# Patient Record
Sex: Male | Born: 1965 | Race: Black or African American | Hispanic: No | Marital: Married | Smoking: Never smoker
Health system: Southern US, Community
[De-identification: ages and names within clinical notes are randomized; demographics above are authoritative.]

## PROBLEM LIST (undated history)

## (undated) DIAGNOSIS — E039 Hypothyroidism, unspecified: Secondary | ICD-10-CM

---

## 2005-08-30 ENCOUNTER — Ambulatory Visit: Payer: Self-pay | Admitting: Family Medicine

## 2007-07-04 ENCOUNTER — Ambulatory Visit: Payer: Self-pay | Admitting: Family Medicine

## 2007-07-06 ENCOUNTER — Encounter: Payer: Self-pay | Admitting: Family Medicine

## 2007-08-23 ENCOUNTER — Ambulatory Visit: Payer: Self-pay | Admitting: Family Medicine

## 2007-08-23 DIAGNOSIS — E0590 Thyrotoxicosis, unspecified without thyrotoxic crisis or storm: Secondary | ICD-10-CM | POA: Insufficient documentation

## 2007-08-23 DIAGNOSIS — M722 Plantar fascial fibromatosis: Secondary | ICD-10-CM

## 2007-11-01 ENCOUNTER — Ambulatory Visit: Payer: Self-pay | Admitting: Family Medicine

## 2007-11-01 DIAGNOSIS — R220 Localized swelling, mass and lump, head: Secondary | ICD-10-CM

## 2007-11-01 DIAGNOSIS — R221 Localized swelling, mass and lump, neck: Secondary | ICD-10-CM

## 2009-04-01 IMAGING — US US THYROID
1 series · 18 of 25 positions shown · non-contrast
Comparison: none

REASON FOR EXAM: hyperthyroidism
COMMENTS:

PROCEDURE:     US  - US THYROID  - July 04, 2007  [DATE]
RESULT:     The RIGHT thyroid lobe measures 5.4 x 2.6 x 2.6 cm. The LEFT
thyroid lobe measures 4.9 x 2.1 x 2.5 cm. The echotexture is heterogeneous.
No discrete nodule or cyst is identified.

[Series 1: us thyroid · 18 of 35 slices shown]
[im 1/35]
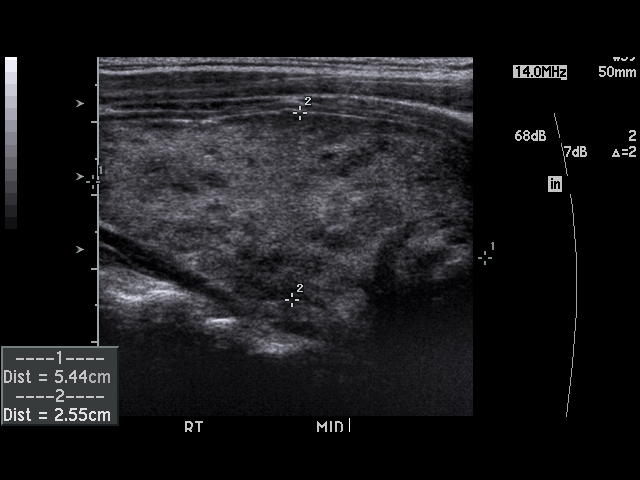
[im 3/35]
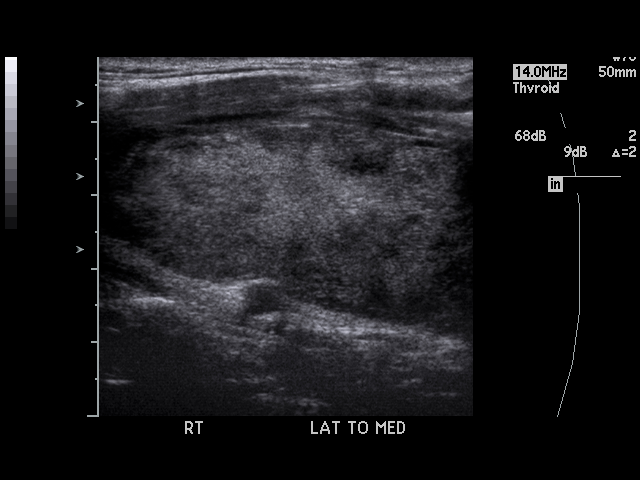
[im 5/35]
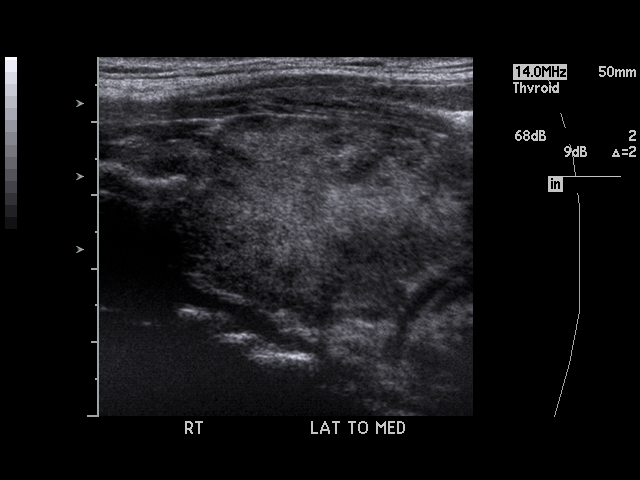
[im 6/35]
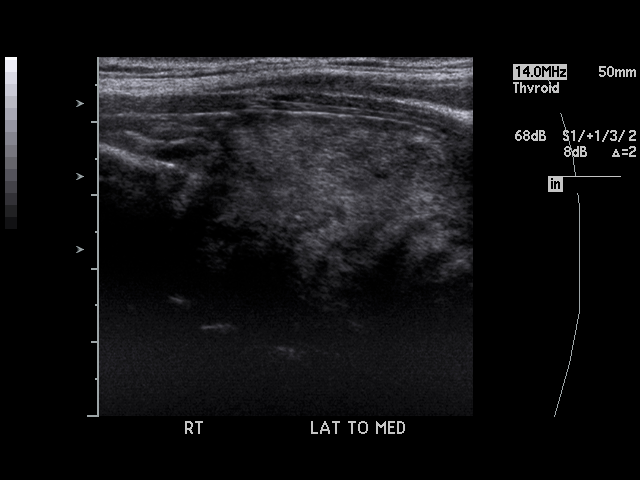
[im 9/35]
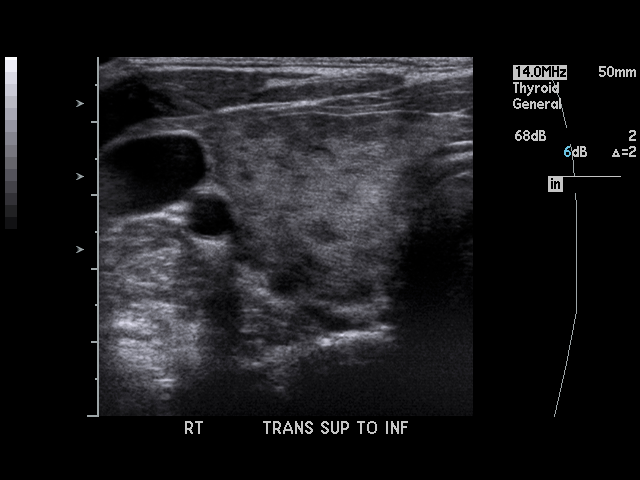
[im 10/35]
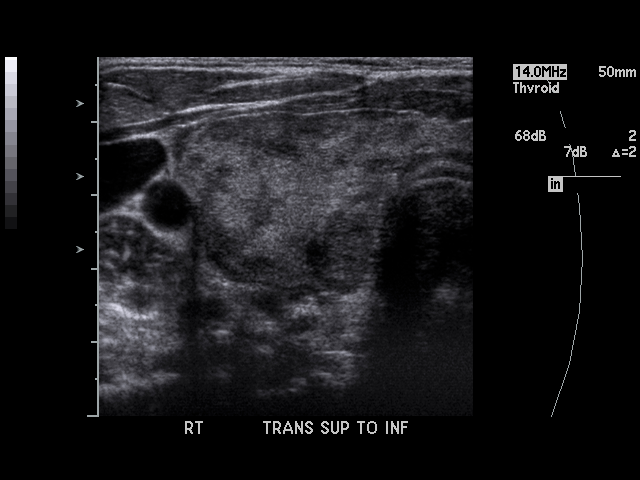
[im 13/35]
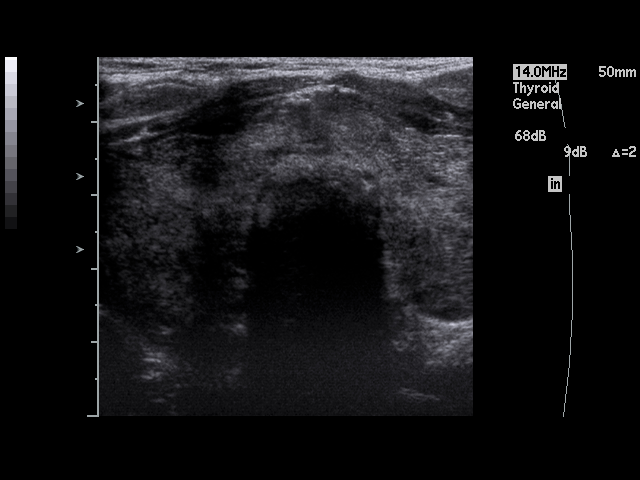
[im 15/35]
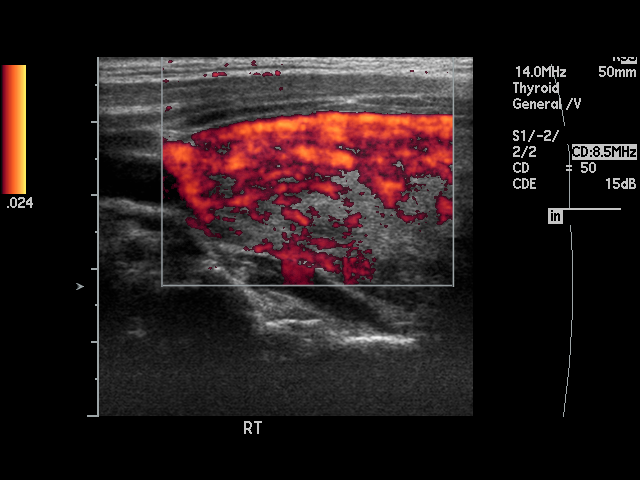
[im 16/35]
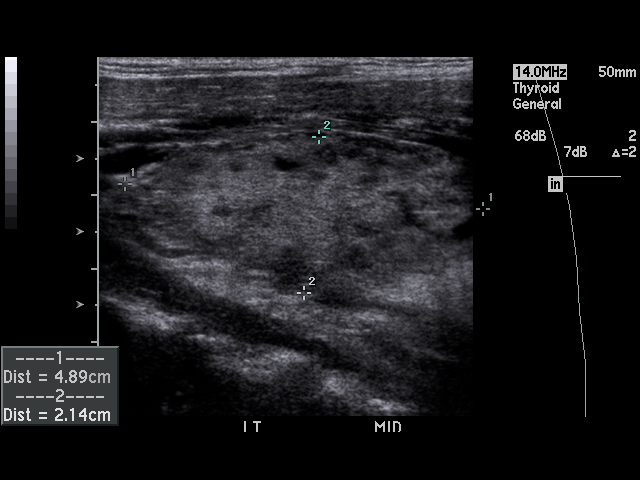
[im 19/35]
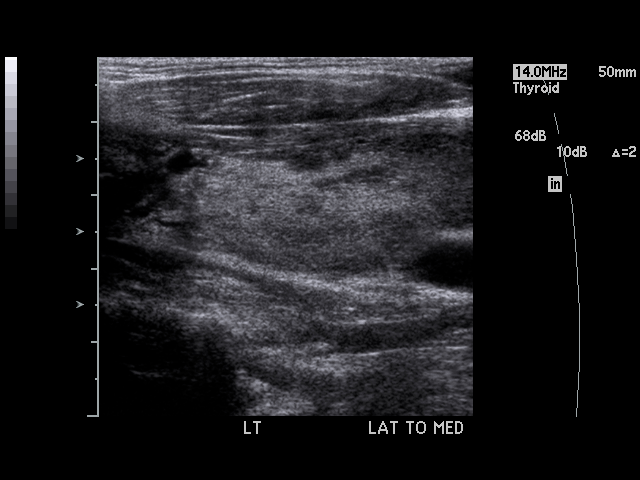
[im 20/35]
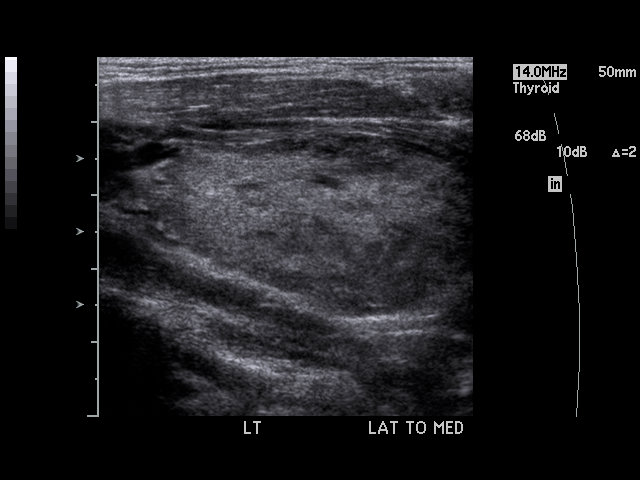
[im 22/35]
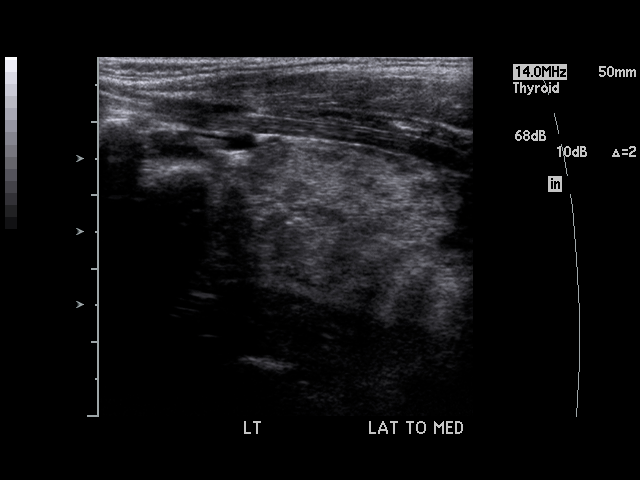
[im 25/35]
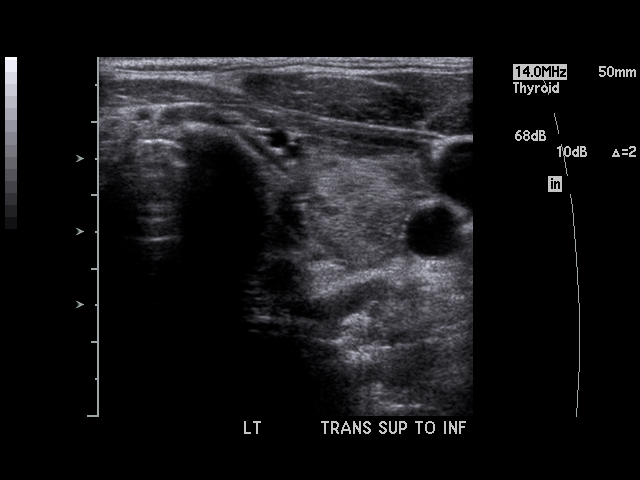
[im 26/35]
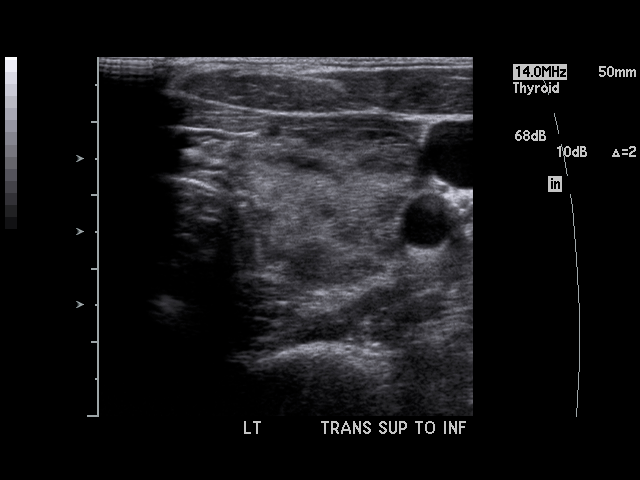
[im 29/35]
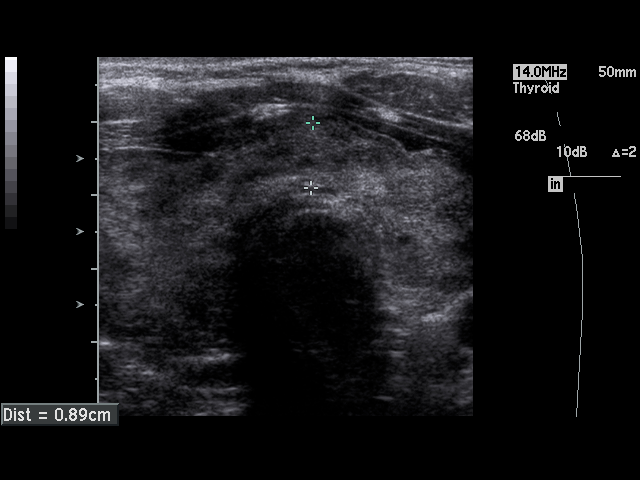
[im 30/35]
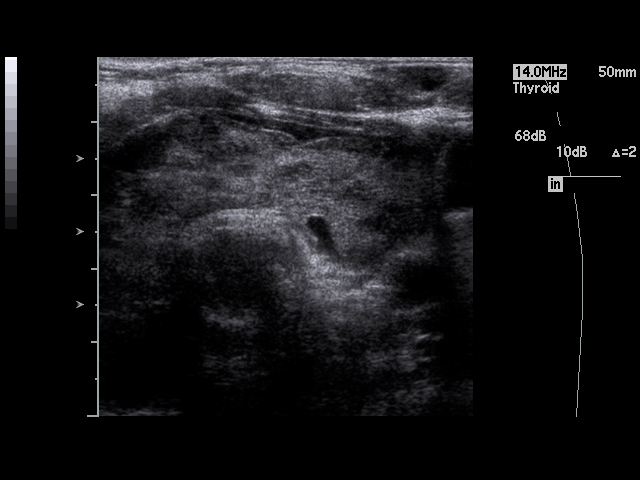
[im 32/35]
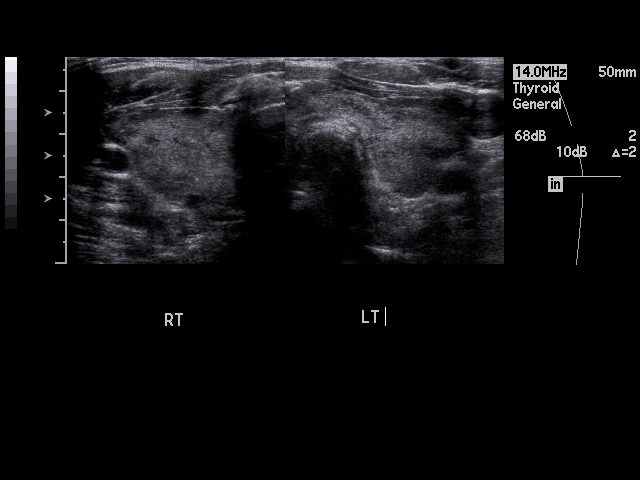
[im 35/35]
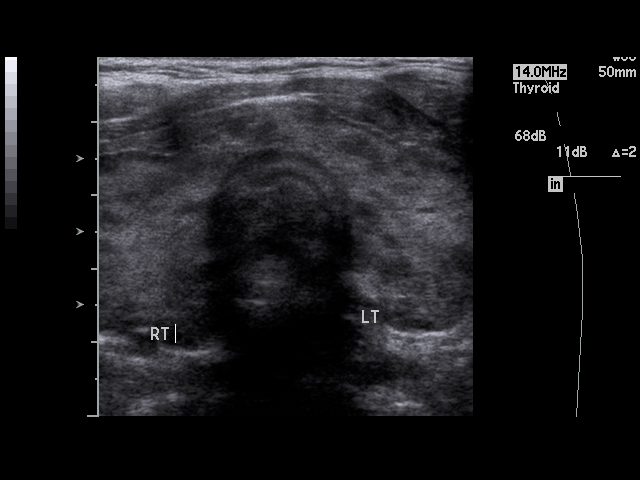

[18 of 25 positions shown; findings below may reference images not displayed]

IMPRESSION: There is a heterogeneous echotexture of the thyroid lobes. I do not see
discrete abnormal nodules.

## 2014-03-18 ENCOUNTER — Ambulatory Visit: Payer: Self-pay | Admitting: Gastroenterology

## 2016-05-03 ENCOUNTER — Other Ambulatory Visit: Payer: Self-pay | Admitting: Internal Medicine

## 2016-05-03 ENCOUNTER — Ambulatory Visit
Admission: RE | Admit: 2016-05-03 | Discharge: 2016-05-03 | Disposition: A | Discharge status: Home / Self Care | Payer: BLUE CROSS/BLUE SHIELD | Source: Ambulatory Visit | Attending: Internal Medicine | Admitting: Internal Medicine

## 2016-05-03 DIAGNOSIS — M25561 Pain in right knee: Secondary | ICD-10-CM

## 2016-05-03 DIAGNOSIS — M7989 Other specified soft tissue disorders: Secondary | ICD-10-CM | POA: Insufficient documentation

## 2016-05-03 DIAGNOSIS — M79604 Pain in right leg: Secondary | ICD-10-CM | POA: Insufficient documentation

## 2016-05-03 DIAGNOSIS — M25562 Pain in left knee: Secondary | ICD-10-CM

## 2018-06-01 IMAGING — US US EXTREM LOW VENOUS BILAT
1 series · 13 of 24 positions shown · non-contrast
Comparison: None.

CLINICAL DATA: Lower extremity pain for 5 days

EXAM:
BILATERAL LOWER EXTREMITY VENOUS DUPLEX ULTRASOUND
TECHNIQUE: Gray-scale sonography with graded compression, as well as color
Doppler and duplex ultrasound were performed to evaluate the lower
extremity deep venous systems from the level of the common femoral
vein and including the common femoral, femoral, profunda femoral,
popliteal and calf veins including the posterior tibial, peroneal
and gastrocnemius veins when visible. The superficial great
saphenous vein was also interrogated. Spectral Doppler was utilized
to evaluate flow at rest and with distal augmentation maneuvers in
the common femoral, femoral and popliteal veins.

[Series 1: us extrem low venous bilat · 13 of 64 slices shown]
[im 1/64]
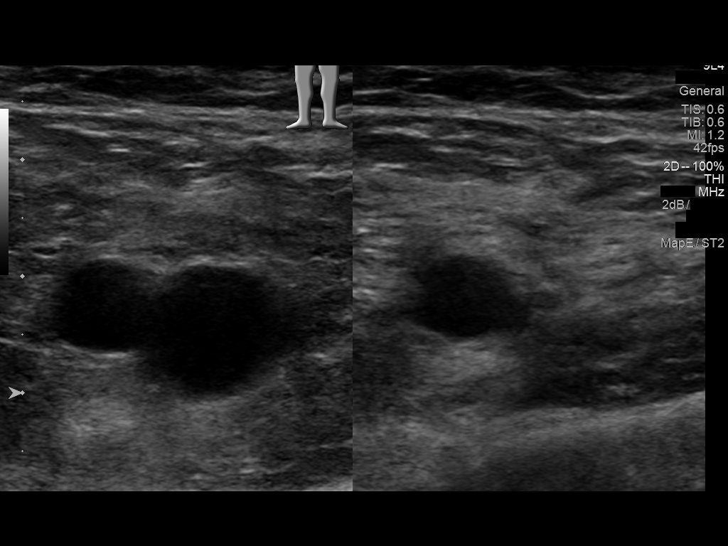
[im 6/64]
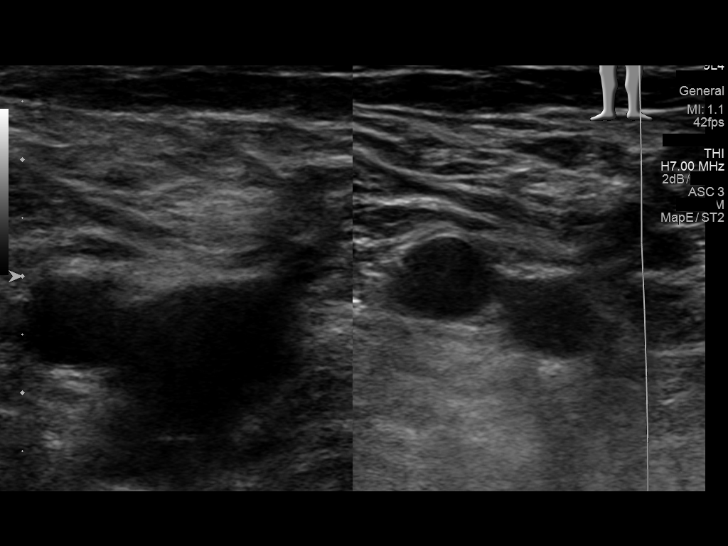
[im 11/64]
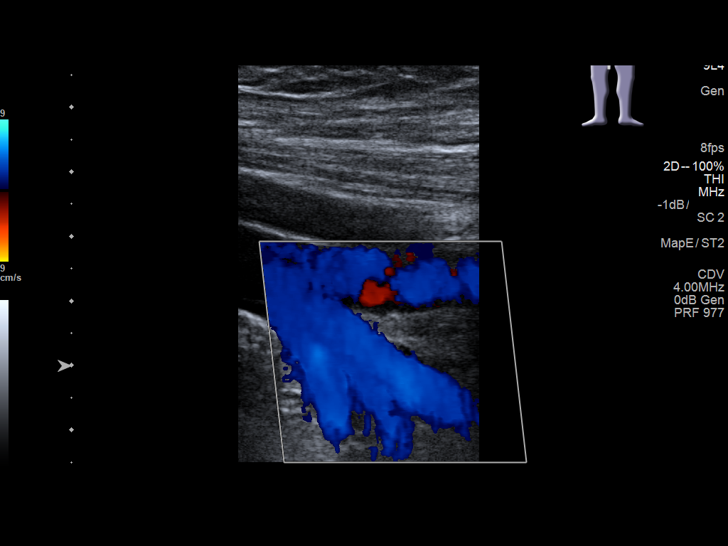
[im 17/64]
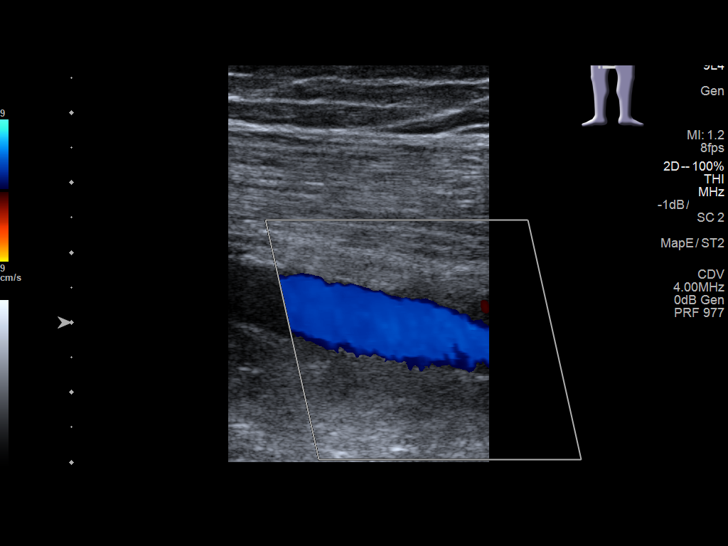
[im 22/64]
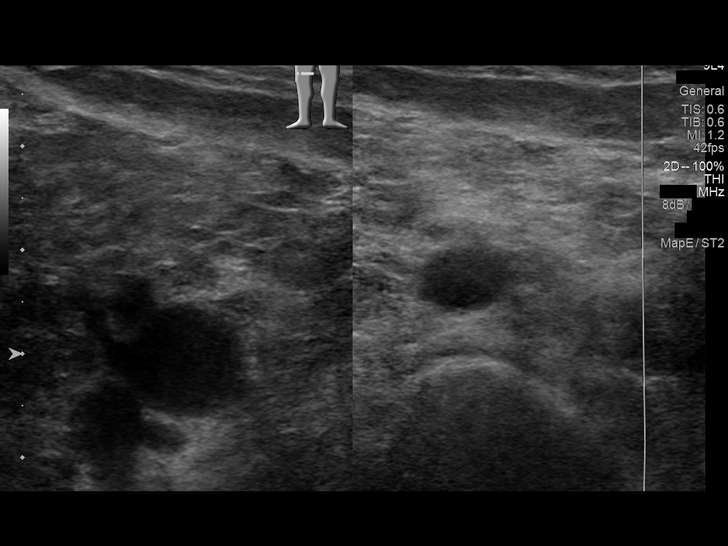
[im 28/64]
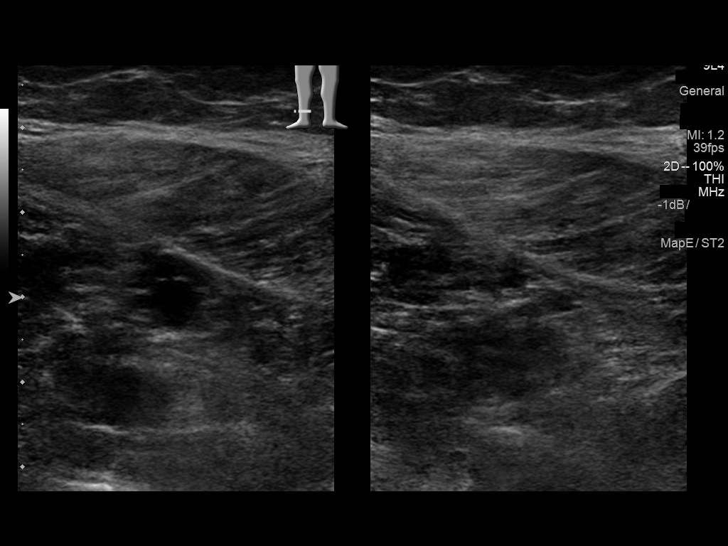
[im 33/64]
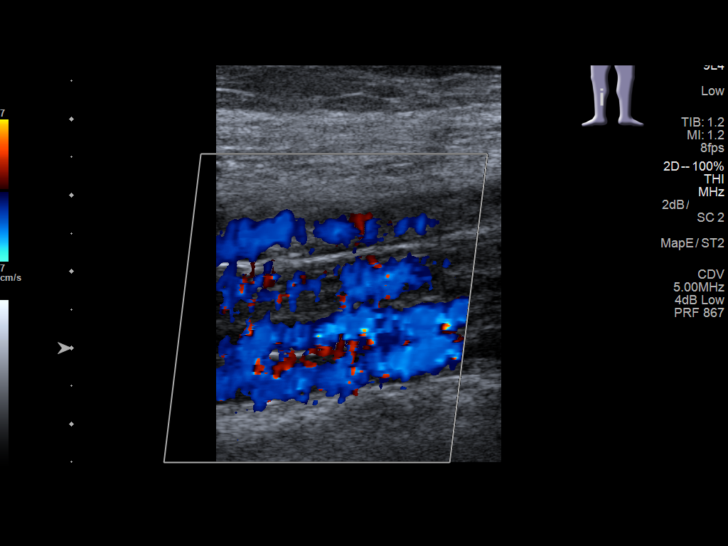
[im 36/64]
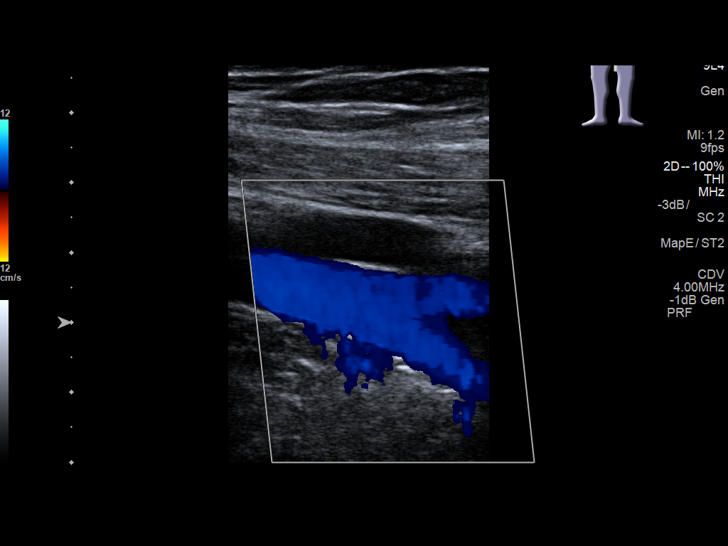
[im 42/64]
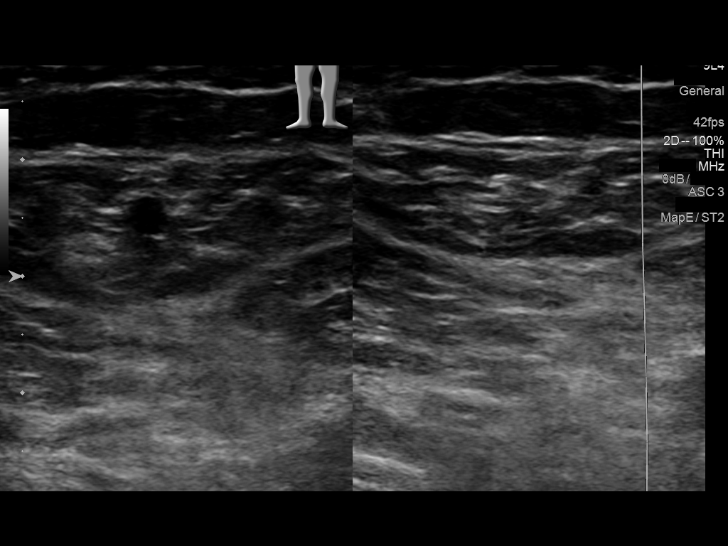
[im 47/64]
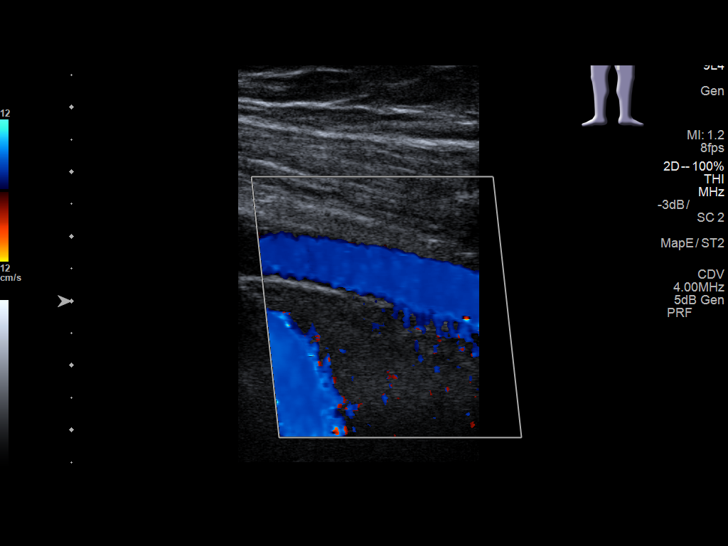
[im 53/64]
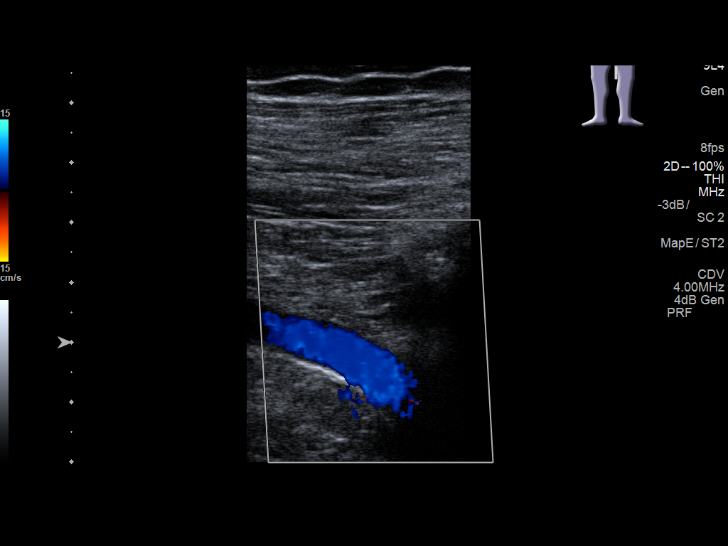
[im 58/64]
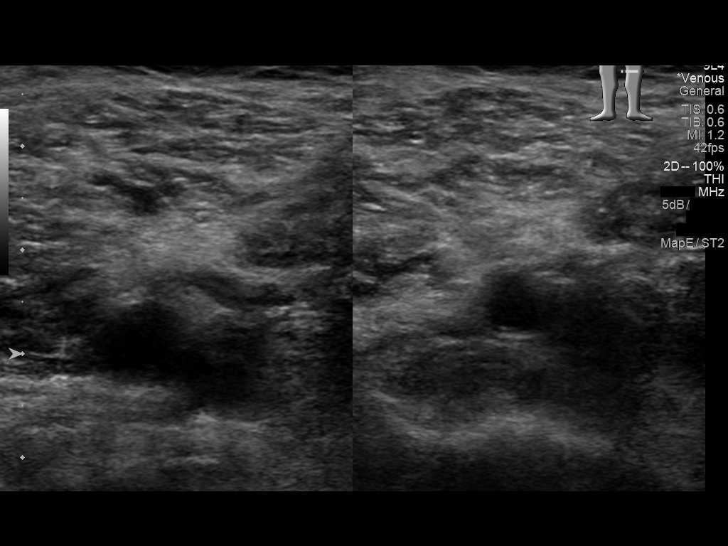
[im 64/64]
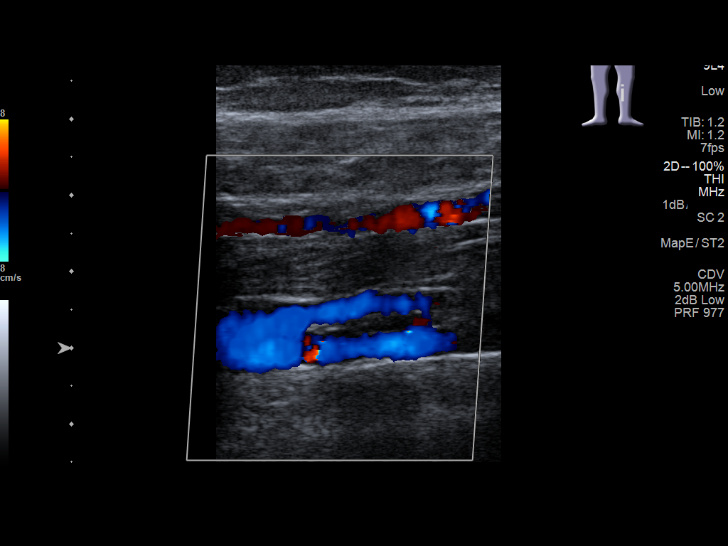

[13 of 24 positions shown; findings below may reference images not displayed]

FINDINGS: RIGHT LOWER EXTREMITY

Common Femoral Vein: No evidence of thrombus. Normal
compressibility, respiratory phasicity and response to augmentation.

Saphenofemoral Junction: No evidence of thrombus. Normal
compressibility and flow on color Doppler imaging.

Profunda Femoral Vein: No evidence of thrombus. Normal
compressibility and flow on color Doppler imaging.

Femoral Vein: No evidence of thrombus. Normal compressibility,
respiratory phasicity and response to augmentation.

Popliteal Vein: No evidence of thrombus. Normal compressibility,
respiratory phasicity and response to augmentation.

Calf Veins: No evidence of thrombus. Normal compressibility and flow
on color Doppler imaging.

Superficial Great Saphenous Vein: No evidence of thrombus. Normal
compressibility and flow on color Doppler imaging.

Venous Reflux:  None.

Other Findings:  None.

LEFT LOWER EXTREMITY

Common Femoral Vein: No evidence of thrombus. Normal
compressibility, respiratory phasicity and response to augmentation.

Saphenofemoral Junction: No evidence of thrombus. Normal
compressibility and flow on color Doppler imaging.

Profunda Femoral Vein: No evidence of thrombus. Normal
compressibility and flow on color Doppler imaging.

Femoral Vein: No evidence of thrombus. Normal compressibility,
respiratory phasicity and response to augmentation.

Popliteal Vein: No evidence of thrombus. Normal compressibility,
respiratory phasicity and response to augmentation.

Calf Veins: No evidence of thrombus. Normal compressibility and flow
on color Doppler imaging.

Superficial Great Saphenous Vein: No evidence of thrombus. Normal
compressibility and flow on color Doppler imaging.

Venous Reflux:  None.

Other Findings:  None.
IMPRESSION: No evidence of deep venous thrombosis in either lower extremity.

## 2020-12-10 ENCOUNTER — Other Ambulatory Visit: Payer: Self-pay

## 2020-12-10 ENCOUNTER — Ambulatory Visit: Payer: Self-pay

## 2020-12-10 DIAGNOSIS — Z23 Encounter for immunization: Secondary | ICD-10-CM

## 2021-06-09 ENCOUNTER — Telehealth: Payer: Self-pay

## 2021-06-09 NOTE — Telephone Encounter (Signed)
CALLED PATIENT NO ANSWER LEFT VOICEMAIL FOR A CALL BACK ? ?

## 2021-06-10 ENCOUNTER — Telehealth: Payer: Self-pay

## 2021-06-10 NOTE — Telephone Encounter (Signed)
CALLED NO ANSWER NO VOICEMAIL

## 2021-06-11 ENCOUNTER — Telehealth: Payer: Self-pay

## 2021-06-11 NOTE — Telephone Encounter (Signed)
CALLED PATIENT NO ANSWER LEFT VOICEMAIL FOR A CALL BACK °Letter sent °

## 2021-06-17 ENCOUNTER — Other Ambulatory Visit: Payer: Self-pay

## 2021-06-17 DIAGNOSIS — Z1211 Encounter for screening for malignant neoplasm of colon: Secondary | ICD-10-CM

## 2021-06-17 MED ORDER — NA SULFATE-K SULFATE-MG SULF 17.5-3.13-1.6 GM/177ML PO SOLN
1.0000 | Freq: Once | ORAL | 0 refills | Status: AC
Start: 2021-06-17 — End: 2021-06-17

## 2021-06-17 NOTE — Progress Notes (Signed)
Gastroenterology Pre-Procedure Review ? ?Request Date: 06/25/21 ?Requesting Physician: Dr. Vicente Males ? ?PATIENT REVIEW QUESTIONS: The patient responded to the following health history questions as indicated:   ? ?1. Are you having any GI issues? no ?2. Do you have a personal history of Polyps? no ?3. Do you have a family history of Colon Cancer or Polyps? no ?4. Diabetes Mellitus? no ?5. Joint replacements in the past 12 months?no ?6. Major health problems in the past 3 months?no ?7. Any artificial heart valves, MVP, or defibrillator?no ?   ?MEDICATIONS & ALLERGIES:    ?Patient reports the following regarding taking any anticoagulation/antiplatelet therapy:   ?Plavix, Coumadin, Eliquis, Xarelto, Lovenox, Pradaxa, Brilinta, or Effient? no ?Aspirin? no ? ?Patient confirms/reports the following medications:  ?No current outpatient medications on file.  ? ?No current facility-administered medications for this visit.  ? ? ?Patient confirms/reports the following allergies:  ?Not on File ? ?No orders of the defined types were placed in this encounter. ? ? ?AUTHORIZATION INFORMATION ?Primary Insurance: ?1D#: ?Group #: ? ?Secondary Insurance: ?1D#: ?Group #: ? ?SCHEDULE INFORMATION: ?Date: 06/25/21 ?Time: ?Location: [facility] ?

## 2021-06-25 ENCOUNTER — Ambulatory Visit: Payer: BC Managed Care – PPO | Admitting: Anesthesiology

## 2021-06-25 ENCOUNTER — Encounter
Admission: RE | Disposition: A | Discharge status: Home / Self Care | Payer: Self-pay | Source: Home / Self Care | Attending: Gastroenterology

## 2021-06-25 ENCOUNTER — Encounter: Payer: Self-pay | Admitting: Gastroenterology

## 2021-06-25 ENCOUNTER — Ambulatory Visit
Admission: RE | Admit: 2021-06-25 | Discharge: 2021-06-25 | Disposition: A | Discharge status: Home / Self Care | Payer: BC Managed Care – PPO | Attending: Gastroenterology | Admitting: Gastroenterology

## 2021-06-25 DIAGNOSIS — E039 Hypothyroidism, unspecified: Secondary | ICD-10-CM | POA: Insufficient documentation

## 2021-06-25 DIAGNOSIS — Z1211 Encounter for screening for malignant neoplasm of colon: Secondary | ICD-10-CM | POA: Diagnosis present

## 2021-06-25 HISTORY — DX: Hypothyroidism, unspecified: E03.9

## 2021-06-25 HISTORY — PX: COLONOSCOPY WITH PROPOFOL: SHX5780

## 2021-06-25 SURGERY — COLONOSCOPY WITH PROPOFOL
Anesthesia: General

## 2021-06-25 MED ORDER — LIDOCAINE HCL (CARDIAC) PF 100 MG/5ML IV SOSY
PREFILLED_SYRINGE | INTRAVENOUS | Status: DC | PRN
  Administered 2021-06-25: 40 mg via INTRAVENOUS

## 2021-06-25 MED ORDER — PROPOFOL 500 MG/50ML IV EMUL
INTRAVENOUS | Status: AC
  Filled 2021-06-25: qty 50

## 2021-06-25 MED ORDER — LIDOCAINE HCL (PF) 2 % IJ SOLN
INTRAMUSCULAR | Status: AC
  Filled 2021-06-25: qty 5

## 2021-06-25 MED ORDER — SODIUM CHLORIDE 0.9 % IV SOLN: INTRAVENOUS | Status: DC

## 2021-06-25 MED ORDER — STERILE WATER FOR IRRIGATION IR SOLN
Status: DC | PRN
  Administered 2021-06-25: 60 mL

## 2021-06-25 MED ORDER — PROPOFOL 500 MG/50ML IV EMUL
INTRAVENOUS | Status: DC | PRN
  Administered 2021-06-25: 150 ug/kg/min via INTRAVENOUS

## 2021-06-25 MED ORDER — PROPOFOL 10 MG/ML IV BOLUS
INTRAVENOUS | Status: DC | PRN
  Administered 2021-06-25: 90 mg via INTRAVENOUS

## 2021-06-25 NOTE — Transfer of Care (Signed)
Immediate Anesthesia Transfer of Care Note ? ?Patient: Duane Singleton ? ?Procedure(s) Performed: COLONOSCOPY WITH PROPOFOL ? ?Patient Location: PACU and Endoscopy Unit ? ?Anesthesia Type:General ? ?Level of Consciousness: drowsy ? ?Airway & Oxygen Therapy: Patient Spontanous Breathing ? ?Post-op Assessment: Report given to RN ? ?Post vital signs: stable ? ?Last Vitals:  ?Vitals Value Taken Time  ?BP    ?Temp    ?Pulse 55 06/25/21 0919  ?Resp 13 06/25/21 0919  ?SpO2 99 % 06/25/21 0919  ?Vitals shown include unvalidated device data. ? ?Last Pain:  ?Vitals:  ? 06/25/21 0811  ?TempSrc: Temporal  ?PainSc: 0-No pain  ?   ? ?  ? ?Complications: No notable events documented. ?

## 2021-06-25 NOTE — Op Note (Addendum)
[facility] ?Gastroenterology ?Patient Name: Duane Singleton ?Procedure Date: 06/25/2021 8:47 AM ?MRN: [id number] ?Account #: [id number] ?Date of Birth: [birth year] ?Admit Type: Outpatient ?Age: 56 ?Room: [facility] ENDO ROOM 4 ?Gender: Male ?Note Status: Finalized ?Instrument Name: Colonoscope [id number] ?Procedure:             Colonoscopy ?Indications:           Screening for colorectal malignant neoplasm ?Providers:             Jonathon Bellows MD, MD ?Referring MD:          Casilda Carls, MD (Referring MD) ?Medicines:             Monitored Anesthesia Care ?Complications:         No immediate complications. ?Procedure:             Pre-Anesthesia Assessment: ?                       - Prior to the procedure, a History and Physical was  ?                       performed, and patient medications, allergies and  ?                       sensitivities were reviewed. The patient's tolerance  ?                       of previous anesthesia was reviewed. ?                       - The risks and benefits of the procedure and the  ?                       sedation options and risks were discussed with the  ?                       patient. All questions were answered and informed  ?                       consent was obtained. ?                       - ASA Grade Assessment: II - A patient with mild  ?                       systemic disease. ?                       After obtaining informed consent, the colonoscope was  ?                       passed under direct vision. Throughout the procedure,  ?                       the patient's blood pressure, pulse, and oxygen  ?                       saturations were monitored continuously. The  ?                       Colonoscope was introduced  through the anus and  ?                       advanced to the the cecum, identified by the  ?                       appendiceal orifice. The colonoscopy was performed  ?                       with ease. The patient tolerated the procedure well.   ?                       The quality of the bowel preparation was excellent. ?Findings: ?     The perianal and digital rectal examinations were normal. ?     The entire examined colon appeared normal on direct and retroflexion  ?     views. ?Impression:            - The entire examined colon is normal on direct and  ?                       retroflexion views. ?                       - No specimens collected. ?Recommendation:        - Discharge patient to home. ?                       - Resume previous diet. ?                       - Continue present medications. ?                       - Repeat colonoscopy in 10 years for screening  ?                       purposes. ?                       - Return to GI office PRN. ?Procedure Code(s):     --- Professional --- ?                       [phone], Colonoscopy, flexible; diagnostic, including  ?                       collection of specimen(s) by brushing or washing, when  ?                       performed (separate procedure) ?Diagnosis Code(s):     --- Professional --- ?                       Z12.11, Encounter for screening for malignant neoplasm  ?                       of colon ?CPT copyright 2019 American Medical Association. All rights reserved. ?The codes documented in this report are preliminary and upon coder review may  ?be revised to meet current compliance requirements. ?Jonathon Bellows, MD ?Jonathon Bellows MD, MD ?06/25/2021 9:23:07 AM ?This report has been signed  electronically. ?Number of Addenda: 0 ?Note Initiated On: 06/25/2021 8:47 AM ?Scope Withdrawal Time: 0 hours 9 minutes 30 seconds  ?Total Procedure Duration: 0 hours 17 minutes 54 seconds  ?Estimated Blood Loss:  Estimated blood loss: none. ?     [facility] ?

## 2021-06-25 NOTE — H&P (Signed)
? ? ? ?  Jonathon Bellows, MD ?[address], [place], [place], [place], [address] ?[address] [place], [place], [place], [address] ?Phone: [phone]  ?Fax: [phone] ? ?Primary Care Physician:  Casilda Carls, MD ? ? ?Pre-Procedure History & Physical: ?HPI:  Duane Singleton is a 56 y.o. male is here for an colonoscopy. ?  ?Past Medical History:  ?Diagnosis Date  ? Hypothyroidism   ? ? ?History reviewed. No pertinent surgical history. ? ?Prior to Admission medications   ?Medication Sig Start Date End Date Taking? Authorizing Provider  ?levothyroxine (SYNTHROID) 88 MCG tablet Take 88 mcg by mouth daily. 06/12/21  Yes [provider]  ? ? ?Allergies as of 06/17/2021  ? (No Known Allergies)  ? ? ?History reviewed. No pertinent family history. ? ?Social History  ? ?Socioeconomic History  ? Marital status: Married  ?  Spouse name: Not on file  ? Number of children: Not on file  ? Years of education: Not on file  ? Highest education level: Not on file  ?Occupational History  ? Not on file  ?Tobacco Use  ? Smoking status: Never  ? Smokeless tobacco: Never  ?Vaping Use  ? Vaping Use: Never used  ?Substance and Sexual Activity  ? Alcohol use: Never  ? Drug use: Never  ? Sexual activity: Yes  ?Other Topics Concern  ? Not on file  ?Social History Narrative  ? Not on file  ? ?Social Determinants of Health  ? ?Financial Resource Strain: Not on file  ?Food Insecurity: Not on file  ?Transportation Needs: Not on file  ?Physical Activity: Not on file  ?Stress: Not on file  ?Social Connections: Not on file  ?Intimate Partner Violence: Not on file  ? ? ?Review of Systems: ?See HPI, otherwise negative ROS ? ?Physical Exam: ?BP 134/88   Pulse (!) 58   Temp (!) 96.7 ?F (35.9 ?C) (Temporal)   Resp 16   Ht '5\' 5"'$  (1.651 m)   Wt 79.4 kg   SpO2 100%   BMI 29.12 kg/m?  ?General:   Alert,  pleasant and cooperative in NAD ?Head:  Normocephalic and atraumatic. ?Neck:  Supple; no masses or thyromegaly. ?Lungs:  Clear  throughout to auscultation, normal respiratory effort.    ?Heart:  +S1, +S2, Regular rate and rhythm, No edema. ?Abdomen:  Soft, nontender and nondistended. Normal bowel sounds, without guarding, and without rebound.   ?Neurologic:  Alert and  oriented x4;  grossly normal neurologically. ? ?Impression/Plan: ?Duane Singleton is here for an colonoscopy to be performed for Screening colonoscopy average risk   ?Risks, benefits, limitations, and alternatives regarding  colonoscopy have been reviewed with the patient.  Questions have been answered.  All parties agreeable. ? ? ?Jonathon Bellows, MD  06/25/2021, 8:54 AM ? ?

## 2021-06-25 NOTE — Anesthesia Postprocedure Evaluation (Signed)
Anesthesia Post Note ? ?Patient: Duane Singleton ? ?Procedure(s) Performed: COLONOSCOPY WITH PROPOFOL ? ?Patient location during evaluation: Endoscopy ?Anesthesia Type: General ?Level of consciousness: awake and alert ?Pain management: pain level controlled ?Vital Signs Assessment: post-procedure vital signs reviewed and stable ?Respiratory status: spontaneous breathing, nonlabored ventilation, respiratory function stable and patient connected to nasal cannula oxygen ?Cardiovascular status: blood pressure returned to baseline and stable ?Postop Assessment: no apparent nausea or vomiting ?Anesthetic complications: no ? ? ?No notable events documented. ? ? ?Last Vitals:  ?Vitals:  ? 06/25/21 0940 06/25/21 0950  ?BP: 100/66 111/76  ?Pulse:    ?Resp:    ?Temp:    ?SpO2:    ?  ?Last Pain:  ?Vitals:  ? 06/25/21 0950  ?TempSrc:   ?PainSc: 0-No pain  ? ? ?  ?  ?  ?  ?  ?  ? ?Martha Clan ? ? ? ? ?

## 2021-06-25 NOTE — Anesthesia Preprocedure Evaluation (Signed)
Anesthesia Evaluation  ?Patient identified by MRN, date of birth, ID band ?Patient awake ? ? ? ?Reviewed: ?Allergy & Precautions, H&P , NPO status , Patient's Chart, lab work & pertinent test results, reviewed documented beta blocker date and time  ? ?History of Anesthesia Complications ?Negative for: history of anesthetic complications ? ?Airway ?Mallampati: III ? ?TM Distance: >3 FB ?Neck ROM: full ? ? ? Dental ? ?(+) Dental Advidsory Given, Teeth Intact ?  ?Pulmonary ?neg pulmonary ROS,  ?  ?Pulmonary exam normal ?breath sounds clear to auscultation ? ? ? ? ? ? Cardiovascular ?Exercise Tolerance: Good ?negative cardio ROS ?Normal cardiovascular exam ?Rhythm:regular Rate:Normal ? ? ?  ?Neuro/Psych ?negative neurological ROS ? negative psych ROS  ? GI/Hepatic ?negative GI ROS, Neg liver ROS,   ?Endo/Other  ?neg diabetesHypothyroidism  ? Renal/GU ?negative Renal ROS  ?negative genitourinary ?  ?Musculoskeletal ? ? Abdominal ?  ?Peds ? Hematology ?negative hematology ROS ?(+)   ?Anesthesia Other Findings ?Past Medical History: ?No date: Hypothyroidism ? ? Reproductive/Obstetrics ?negative OB ROS ? ?  ? ? ? ? ? ? ? ? ? ? ? ? ? ?  ?  ? ? ? ? ? ? ? ? ?Anesthesia Physical ?Anesthesia Plan ? ?ASA: 2 ? ?Anesthesia Plan: General  ? ?Post-op Pain Management:   ? ?Induction: Intravenous ? ?PONV Risk Score and Plan: Propofol infusion and TIVA ? ?Airway Management Planned: Natural Airway and Nasal Cannula ? ?Additional Equipment:  ? ?Intra-op Plan:  ? ?Post-operative Plan:  ? ?Informed Consent: I have reviewed the patients History and Physical, chart, labs and discussed the procedure including the risks, benefits and alternatives for the proposed anesthesia with the patient or authorized representative who has indicated his/her understanding and acceptance.  ? ? ? ?Dental Advisory Given ? ?Plan Discussed with: Anesthesiologist, CRNA and Surgeon ? ?Anesthesia Plan Comments:    ? ? ? ? ? ? ?Anesthesia Quick Evaluation ? ?

## 2021-06-26 ENCOUNTER — Encounter: Payer: Self-pay | Admitting: Gastroenterology

## 2021-07-07 ENCOUNTER — Ambulatory Visit: Payer: Self-pay | Admitting: Medical

## 2021-07-07 ENCOUNTER — Encounter: Payer: Self-pay | Admitting: Medical

## 2021-07-07 VITALS — BP 118/78 | HR 69 | Temp 97.5°F | Resp 16

## 2021-07-07 DIAGNOSIS — H1012 Acute atopic conjunctivitis, left eye: Secondary | ICD-10-CM

## 2021-07-07 MED ORDER — OLOPATADINE HCL 0.1 % OP SOLN: 1.0000 [drp] | Freq: Two times a day (BID) | OPHTHALMIC | 12 refills | Status: AC

## 2021-07-07 NOTE — Patient Instructions (Signed)
Allergic Conjunctivitis, Adult  Allergic conjunctivitis is inflammation of the clear membrane (conjunctiva) that covers the white part of your eye and the inner surface of your eyelid. This condition can make your eye red or pink. It can also make your eye feel itchy. This condition cannot be spread from one person to another person (is not contagious). What are the causes? This condition is caused by allergens. These are things that can cause an allergic reaction in some people but not in others. Common allergens include: Outdoor allergens, such as: Pollen, including pollen from grass and weeds. Mold. Car fumes. Indoor allergens, such as: Dust. Smoke. Mold. Proteins in a pet's pee (urine), saliva, or dander. What increases the risk? You are more likely to develop this condition if you have a family history of these things: Allergies. Conditions that you get because of allergens, such as asthma or inflammation of the skin (eczema). What are the signs or symptoms? Symptoms of this condition include eyes that are: Itchy. Red. Watery. Puffy. Your eyes may also: Sting or burn. Have clear fluid draining from them. Have thick mucus coming from them. How is this treated? This condition may be treated with: Cold, wet cloths (cold compresses) to soothe itching and swelling. Washing the face to remove allergens. Eye drops. These may include: Eye drops that block allergies. Eye drops that reduce swelling and irritation. Steroid eye drops if other treatments have not worked. Oral antihistamine medicines. These medicines lessen your allergies. You may need these if eye drops do not help or are difficult to use. Follow these instructions at home: Eye care Place a cool, clean washcloth on your eye for 10-20 minutes. Do this 3-4 times a day. Do not touch or rub your eyes. Do not wear contact lenses until the inflammation is gone. Wear glasses instead. Do not wear eye makeup until the  inflammation is gone. General instructions Try not to be around things that you are allergic to. Take or apply over-the-counter and prescription medicines only as told by your doctor. These include any eye drops. Drink enough fluid to keep your pee pale yellow. Keep all follow-up visits as told by your doctor. This is important. Contact a doctor if: Your symptoms get worse. Your symptoms do not get better with treatment. You have mild eye pain. You are sensitive to light. You have spots or blisters on your eyes. You have pus coming from your eye. You have a fever. Get help right away if: You have redness, swelling, or other symptoms in only one eye. You cannot see well. You have other vision changes. You have very bad eye pain. Summary Allergic conjunctivitis is caused by allergens. It can make your eye red or pink, and it can make your eye feel itchy. This condition cannot be spread from one person to another person (is not contagious). Try not to be around things that you are allergic to. Take or apply over-the-counter and prescription medicines only as told by your doctor. These include any eye drops. Contact your doctor if your symptoms get worse or they do not get better with treatment. This information is not intended to replace advice given to you by your health care provider. Make sure you discuss any questions you have with your health care provider. Document Revised: 01/15/2019 Document Reviewed: 01/15/2019 Elsevier Patient Education  2023 Elsevier Inc.  

## 2021-07-07 NOTE — Progress Notes (Signed)
? ?  Subjective:  ? ? Patient ID: Duane Singleton, male    DOB: May 20, 1965, 56 y.o.   MRN: [id number] ? ?HPI ? ?56 yo male in non acute distress presents to day with left red eye. Starting on Sunday. It does not itch or hurt. He has no visual changes. ? ?He did have some sneezing starting on Thursday , he then took some Claritin and his sneezing stopped. He says he feels great but he is concerned about his eye. ? ?Review of Systems  ?HENT:  Negative for rhinorrhea (resolved by claritin) and sneezing (improving on claritin).   ?Eyes:  Positive for redness. Negative for photophobia, pain, discharge (just clear tears), itching and visual disturbance.  ? ?   ?Objective:  ? Physical Exam ?Vitals and nursing note reviewed.  ?Constitutional:   ?   Appearance: Normal appearance.  ?HENT:  ?   Head: Normocephalic and atraumatic.  ?Eyes:  ?   General: Lids are normal. Vision grossly intact. Gaze aligned appropriately.     ?   Right eye: No discharge (none).  ?   Extraocular Movements: Extraocular movements intact.  ?   Conjunctiva/sclera:  ?   Right eye: Right conjunctiva is injected. No chemosis, exudate or hemorrhage. ?   Left eye: Left conjunctiva is not injected. No chemosis, exudate or hemorrhage. ?   Pupils: Pupils are equal, round, and reactive to light.  ?Neurological:  ?   Mental Status: He is alert.  ? ? ? ? ? ?   ?Assessment & Plan:  ?Allergic Conjunctivitis left eye ?Encounter Diagnosis  ?Name Primary?  ?? Allergic conjunctivitis of left eye Yes  ? ?Meds ordered this encounter  ?Medications  ?? olopatadine (PATANOL) 0.1 % ophthalmic solution  ?  Sig: Place 1 drop into the left eye 2 (two) times daily. X 5-7 days  ?  Dispense:  5 mL  ?  Refill:  12  ?If not improving in 5-7 days to return to the clinic.He verbalizes understanding and has no questions at discharge.  ? ?

## 2021-10-23 ENCOUNTER — Other Ambulatory Visit: Payer: Self-pay

## 2021-10-23 DIAGNOSIS — M129 Arthropathy, unspecified: Secondary | ICD-10-CM

## 2021-10-23 DIAGNOSIS — E559 Vitamin D deficiency, unspecified: Secondary | ICD-10-CM

## 2021-10-23 DIAGNOSIS — E039 Hypothyroidism, unspecified: Secondary | ICD-10-CM

## 2021-10-23 DIAGNOSIS — R609 Edema, unspecified: Secondary | ICD-10-CM

## 2021-10-23 DIAGNOSIS — R5383 Other fatigue: Secondary | ICD-10-CM

## 2021-10-23 DIAGNOSIS — E785 Hyperlipidemia, unspecified: Secondary | ICD-10-CM

## 2021-10-23 DIAGNOSIS — T7840XD Allergy, unspecified, subsequent encounter: Secondary | ICD-10-CM

## 2021-10-24 LAB — LIPID PANEL WITH LDL/HDL RATIO
Cholesterol, Total: 202 mg/dL — ABNORMAL HIGH (ref 100–199)
HDL: 78 mg/dL (ref >39)
LDL Chol Calc (NIH): 115 mg/dL — ABNORMAL HIGH (ref 0–99)
LDL/HDL Ratio: 1.5 ratio (ref 0.0–3.6)
Triglycerides: 49 mg/dL (ref 0–149)
VLDL Cholesterol Cal: 9 mg/dL (ref 5–40)

## 2021-10-24 LAB — BASIC METABOLIC PANEL
BUN/Creatinine Ratio: 17 (ref 9–20)
BUN: 19 mg/dL (ref 6–24)
CO2: 21 mmol/L (ref 20–29)
Calcium: 9.2 mg/dL (ref 8.7–10.2)
Chloride: 105 mmol/L (ref 96–106)
Creatinine, Ser: 1.13 mg/dL (ref 0.76–1.27)
Glucose: 102 mg/dL — ABNORMAL HIGH (ref 70–99)
Potassium: 4.1 mmol/L (ref 3.5–5.2)
Sodium: 140 mmol/L (ref 134–144)
eGFR: 75 mL/min/{1.73_m2} (ref >59)

## 2021-10-24 LAB — CBC WITH DIFFERENTIAL/PLATELET
Basophils Absolute: 0 10*3/uL (ref 0.0–0.2)
Basos: 1 %
EOS (ABSOLUTE): 0 10*3/uL (ref 0.0–0.4)
Eos: 1 %
Hematocrit: 41.5 % (ref 37.5–51.0)
Hemoglobin: 13.7 g/dL (ref 13.0–17.7)
Immature Grans (Abs): 0 10*3/uL (ref 0.0–0.1)
Immature Granulocytes: 0 %
Lymphocytes Absolute: 1.1 10*3/uL (ref 0.7–3.1)
Lymphs: 38 %
MCH: 30.8 pg (ref 26.6–33.0)
MCHC: 33 g/dL (ref 31.5–35.7)
MCV: 93 fL (ref 79–97)
Monocytes Absolute: 0.3 10*3/uL (ref 0.1–0.9)
Monocytes: 9 %
Neutrophils Absolute: 1.4 10*3/uL (ref 1.4–7.0)
Neutrophils: 51 %
Platelets: 224 10*3/uL (ref 150–450)
RBC: 4.45 x10E6/uL (ref 4.14–5.80)
RDW: 12.8 % (ref 11.6–15.4)
WBC: 2.8 10*3/uL — ABNORMAL LOW (ref 3.4–10.8)

## 2021-10-24 LAB — HEPATIC FUNCTION PANEL
ALT: 17 IU/L (ref 0–44)
AST: 23 IU/L (ref 0–40)
Albumin: 4.4 g/dL (ref 3.8–4.9)
Alkaline Phosphatase: 49 IU/L (ref 44–121)
Bilirubin Total: 0.4 mg/dL (ref 0.0–1.2)
Bilirubin, Direct: 0.12 mg/dL (ref 0.00–0.40)
Total Protein: 6.6 g/dL (ref 6.0–8.5)

## 2021-10-24 LAB — VITAMIN D 25 HYDROXY (VIT D DEFICIENCY, FRACTURES): Vit D, 25-Hydroxy: 26.9 ng/mL — ABNORMAL LOW (ref 30.0–100.0)

## 2021-10-24 LAB — VITAMIN B12: Vitamin B-12: 395 pg/mL (ref 232–1245)

## 2021-10-24 LAB — T4, FREE: Free T4: 1.43 ng/dL (ref 0.82–1.77)

## 2021-10-24 LAB — TSH: TSH: 1.67 u[IU]/mL (ref 0.450–4.500)

## 2021-10-24 LAB — T3, FREE: T3, Free: 2.9 pg/mL (ref 2.0–4.4)

## 2021-11-12 ENCOUNTER — Encounter: Payer: Self-pay | Admitting: Nurse Practitioner

## 2021-11-12 ENCOUNTER — Ambulatory Visit (INDEPENDENT_AMBULATORY_CARE_PROVIDER_SITE_OTHER): Payer: Self-pay | Admitting: Nurse Practitioner

## 2021-11-12 VITALS — HR 78 | Temp 97.6°F | Resp 18

## 2021-11-12 DIAGNOSIS — S86911A Strain of unspecified muscle(s) and tendon(s) at lower leg level, right leg, initial encounter: Secondary | ICD-10-CM

## 2021-11-12 NOTE — Progress Notes (Signed)
[facility] [address], [place] [address] [phone]  Office Visit Note  Patient Name: Duane Singleton Date of Birth [id number]  Medical Record number [id number]  Date of Service: 11/12/2021   HPI  56 year old male presenting to [facility] with complaints of right knee pain that started today. He is overall active on his feet and goes up and down stairs often as part of his job.   Yesterday he got bit by something on the back of his right calf, unsure what. No swelling rash or fever noted   Denies other systemic symptoms   Current Medication:  Outpatient Encounter Medications as of 11/12/2021  Medication Sig   levothyroxine (SYNTHROID) 88 MCG tablet Take 88 mcg by mouth daily.   Na Sulfate-K Sulfate-Mg Sulf 17.5-3.13-1.6 GM/177ML SOLN See admin instructions.   olopatadine (PATANOL) 0.1 % ophthalmic solution Place 1 drop into the left eye 2 (two) times daily. X 5-7 days   No facility-administered encounter medications on file as of 11/12/2021.      Medical History: Past Medical History:  Diagnosis Date   Hypothyroidism      Vital Signs: Today's Vitals   11/12/21 1122  Pulse: 78  Resp: 18  Temp: 97.6 F (36.4 C)  SpO2: 98%   There is no height or weight on file to calculate BMI.   Review of Systems  Constitutional: Negative.   HENT: Negative.    Respiratory: Negative.    Cardiovascular: Negative.   Musculoskeletal:  Positive for arthralgias. Negative for joint swelling.  Neurological: Negative.     Physical Exam HENT:     Head: Normocephalic.  Pulmonary:     Effort: Pulmonary effort is normal.  Musculoskeletal:     Right knee: No swelling, effusion or erythema. Normal range of motion. Tenderness present. No medial joint line tenderness.     Left knee: Normal.       Legs:     Comments: No swelling, measured ankles, calves and knees bilaterally equal. Insect bite to posterior right calf without swelling, redness or  warmth. Surrounding skin WNL. Non tender to touch   Neurological:     Mental Status: He is alert.       Assessment/Plan: 1. Strain of right knee, initial encounter Likely a strain to right knee from stretching to observe insect bite Continue to monitor bite for signs of infection Tylenol every 6-8 hours for pain relief from knee Passive ROM exercises  RTC with new/ persistent or worsening symptoms as discussed     General Counseling: Alvan verbalizes understanding of the findings of todays visit and agrees with plan of treatment. I have discussed any further diagnostic evaluation that may be needed or ordered today. We also reviewed his medications today. he has been encouraged to call the office with any questions or concerns that should arise related to todays visit.  Time spent:10 [place] [facility] Family Nurse Practitioner

## 2022-01-22 ENCOUNTER — Encounter: Payer: Self-pay | Admitting: Nurse Practitioner

## 2022-01-22 ENCOUNTER — Ambulatory Visit (INDEPENDENT_AMBULATORY_CARE_PROVIDER_SITE_OTHER): Payer: Self-pay | Admitting: Nurse Practitioner

## 2022-01-22 VITALS — BP 118/70 | HR 66 | Temp 97.9°F | Ht 65.0 in | Wt 189.0 lb

## 2022-01-22 DIAGNOSIS — D229 Melanocytic nevi, unspecified: Secondary | ICD-10-CM

## 2022-01-22 NOTE — Progress Notes (Signed)
[facility] [address], [place] [address] [phone]  Office Visit Note  Patient Name: Duane Singleton Date of Birth [id number]  Medical Record number [id number]  Date of Service: 01/22/2022  Chief Complaint  Patient presents with   Rash    Dark spot on right side of head. Has been there 2-3 weeks. Has not bled or bothering him. Has not gotten bigger     HPI  56 year old male presenting to [facility] with concern for discoloration to right scalp.   Noted 3 weeks ago after haircut. Does not believe it originated as a cut. Does not hurt or itch.   No fevers or other systemic symptoms Denies a history of skin CA  Is not currently established with a dermatologist    Current Medication:  Outpatient Encounter Medications as of 01/22/2022  Medication Sig   levothyroxine (SYNTHROID) 88 MCG tablet Take 88 mcg by mouth daily.   olopatadine (PATANOL) 0.1 % ophthalmic solution Place 1 drop into the left eye 2 (two) times daily. X 5-7 days   Na Sulfate-K Sulfate-Mg Sulf 17.5-3.13-1.6 GM/177ML SOLN See admin instructions. (Patient not taking: Reported on 01/22/2022)   No facility-administered encounter medications on file as of 01/22/2022.      Medical History: Past Medical History:  Diagnosis Date   Hypothyroidism      Vital Signs: BP 118/70 (BP Location: Left Arm, Patient Position: Sitting, Cuff Size: Normal)   Pulse 66   Temp 97.9 F (36.6 C) (Tympanic)   Ht '5\' 5"'$  (1.651 m)   Wt 189 lb (85.7 kg)   SpO2 99%   BMI 31.45 kg/m    Review of Systems  Constitutional: Negative.   HENT: Negative.    Respiratory: Negative.    Cardiovascular: Negative.   Gastrointestinal: Negative.   Musculoskeletal: Negative.   Skin:  Positive for color change.    Physical Exam HENT:     Head: Normocephalic.   Pulmonary:     Effort: Pulmonary effort is normal.  Skin:    Comments: Darkened oval patch to right scalp within hairline. Non  tender to touch. Edges are not well defined. Skin temperature is uniform. Surrounding skin WNL. No drainage/ skin dry and intact   Neurological:     General: No focal deficit present.     Mental Status: He is alert and oriented to person, place, and time. Mental status is at baseline.  Psychiatric:        Mood and Affect: Mood normal.       Assessment/Plan: 1. Change in color of skin mole  - Ambulatory referral to Dermatology    General Counseling: Kerry Dory verbalizes understanding of the findings of todays visit and agrees with plan of treatment. I have discussed any further diagnostic evaluation that may be needed or ordered today. We also reviewed his medications today. he has been encouraged to call the office with any questions or concerns that should arise related to todays visit.   Orders Placed This Encounter  Procedures   Ambulatory referral to Dermatology     Time spent:15 Minutes   Apolonio Schneiders [facility] Family Nurse Practitioner

## 2022-03-16 ENCOUNTER — Other Ambulatory Visit: Payer: Self-pay

## 2022-03-16 DIAGNOSIS — E7849 Other hyperlipidemia: Secondary | ICD-10-CM

## 2022-03-16 DIAGNOSIS — Z125 Encounter for screening for malignant neoplasm of prostate: Secondary | ICD-10-CM

## 2022-03-16 DIAGNOSIS — E559 Vitamin D deficiency, unspecified: Secondary | ICD-10-CM

## 2022-03-16 DIAGNOSIS — R5383 Other fatigue: Secondary | ICD-10-CM

## 2022-03-16 DIAGNOSIS — E038 Other specified hypothyroidism: Secondary | ICD-10-CM

## 2022-03-16 DIAGNOSIS — T7840XD Allergy, unspecified, subsequent encounter: Secondary | ICD-10-CM

## 2022-03-16 DIAGNOSIS — R609 Edema, unspecified: Secondary | ICD-10-CM

## 2022-03-17 LAB — LIPID PANEL
Chol/HDL Ratio: 2.8 ratio (ref 0.0–5.0)
Cholesterol, Total: 197 mg/dL (ref 100–199)
HDL: 71 mg/dL (ref >39)
LDL Chol Calc (NIH): 119 mg/dL — ABNORMAL HIGH (ref 0–99)
Triglycerides: 37 mg/dL (ref 0–149)
VLDL Cholesterol Cal: 7 mg/dL (ref 5–40)

## 2022-03-17 LAB — HEPATIC FUNCTION PANEL
ALT: 15 IU/L (ref 0–44)
AST: 16 IU/L (ref 0–40)
Albumin: 4.3 g/dL (ref 3.8–4.9)
Alkaline Phosphatase: 48 IU/L (ref 44–121)
Bilirubin Total: 0.3 mg/dL (ref 0.0–1.2)
Bilirubin, Direct: 0.1 mg/dL (ref 0.00–0.40)
Total Protein: 6.5 g/dL (ref 6.0–8.5)

## 2022-03-17 LAB — CBC WITH DIFFERENTIAL/PLATELET
Basophils Absolute: 0 10*3/uL (ref 0.0–0.2)
Basos: 1 %
EOS (ABSOLUTE): 0.1 10*3/uL (ref 0.0–0.4)
Eos: 2 %
Hematocrit: 39.2 % (ref 37.5–51.0)
Hemoglobin: 13.5 g/dL (ref 13.0–17.7)
Immature Grans (Abs): 0 10*3/uL (ref 0.0–0.1)
Immature Granulocytes: 0 %
Lymphocytes Absolute: 1.2 10*3/uL (ref 0.7–3.1)
Lymphs: 39 %
MCH: 31.3 pg (ref 26.6–33.0)
MCHC: 34.4 g/dL (ref 31.5–35.7)
MCV: 91 fL (ref 79–97)
Monocytes Absolute: 0.3 10*3/uL (ref 0.1–0.9)
Monocytes: 10 %
Neutrophils Absolute: 1.5 10*3/uL (ref 1.4–7.0)
Neutrophils: 48 %
Platelets: 245 10*3/uL (ref 150–450)
RBC: 4.32 x10E6/uL (ref 4.14–5.80)
RDW: 12.7 % (ref 11.6–15.4)
WBC: 3.1 10*3/uL — ABNORMAL LOW (ref 3.4–10.8)

## 2022-03-17 LAB — PSA: Prostate Specific Ag, Serum: 0.3 ng/mL (ref 0.0–4.0)

## 2022-03-17 LAB — BASIC METABOLIC PANEL
BUN/Creatinine Ratio: 13 (ref 9–20)
BUN: 15 mg/dL (ref 6–24)
CO2: 23 mmol/L (ref 20–29)
Calcium: 8.8 mg/dL (ref 8.7–10.2)
Chloride: 107 mmol/L — ABNORMAL HIGH (ref 96–106)
Creatinine, Ser: 1.12 mg/dL (ref 0.76–1.27)
Glucose: 96 mg/dL (ref 70–99)
Potassium: 4.9 mmol/L (ref 3.5–5.2)
Sodium: 143 mmol/L (ref 134–144)
eGFR: 76 mL/min/{1.73_m2} (ref >59)

## 2022-03-17 LAB — VITAMIN D 25 HYDROXY (VIT D DEFICIENCY, FRACTURES): Vit D, 25-Hydroxy: 23 ng/mL — ABNORMAL LOW (ref 30.0–100.0)

## 2022-03-17 LAB — TSH: TSH: 2.62 u[IU]/mL (ref 0.450–4.500)

## 2022-03-17 LAB — T4, FREE: Free T4: 1.3 ng/dL (ref 0.82–1.77)

## 2022-06-09 ENCOUNTER — Other Ambulatory Visit: Payer: Self-pay

## 2022-06-09 DIAGNOSIS — R5383 Other fatigue: Secondary | ICD-10-CM

## 2022-06-09 DIAGNOSIS — Z125 Encounter for screening for malignant neoplasm of prostate: Secondary | ICD-10-CM

## 2022-06-09 DIAGNOSIS — E039 Hypothyroidism, unspecified: Secondary | ICD-10-CM

## 2022-06-09 DIAGNOSIS — E785 Hyperlipidemia, unspecified: Secondary | ICD-10-CM

## 2022-06-09 DIAGNOSIS — E559 Vitamin D deficiency, unspecified: Secondary | ICD-10-CM

## 2022-06-09 DIAGNOSIS — R609 Edema, unspecified: Secondary | ICD-10-CM

## 2022-06-11 ENCOUNTER — Other Ambulatory Visit (INDEPENDENT_AMBULATORY_CARE_PROVIDER_SITE_OTHER): Payer: Self-pay

## 2022-06-11 DIAGNOSIS — E785 Hyperlipidemia, unspecified: Secondary | ICD-10-CM

## 2022-06-11 DIAGNOSIS — E039 Hypothyroidism, unspecified: Secondary | ICD-10-CM

## 2022-06-11 DIAGNOSIS — R5383 Other fatigue: Secondary | ICD-10-CM

## 2022-06-11 DIAGNOSIS — E559 Vitamin D deficiency, unspecified: Secondary | ICD-10-CM

## 2022-06-11 DIAGNOSIS — Z125 Encounter for screening for malignant neoplasm of prostate: Secondary | ICD-10-CM

## 2022-06-11 NOTE — Progress Notes (Signed)
Labs drawn

## 2022-06-12 LAB — CBC WITH DIFFERENTIAL/PLATELET
Basophils Absolute: 0 10*3/uL (ref 0.0–0.2)
Eos: 2 %
Hematocrit: 42.8 % (ref 37.5–51.0)
Hemoglobin: 14.5 g/dL (ref 13.0–17.7)
Lymphs: 35 %
MCH: 31.6 pg (ref 26.6–33.0)
Monocytes: 9 %
RBC: 4.59 x10E6/uL (ref 4.14–5.80)
RDW: 12.5 % (ref 11.6–15.4)

## 2022-06-13 LAB — CBC WITH DIFFERENTIAL/PLATELET
Basos: 1 %
EOS (ABSOLUTE): 0.1 10*3/uL (ref 0.0–0.4)
Immature Grans (Abs): 0 10*3/uL (ref 0.0–0.1)
Immature Granulocytes: 0 %
Lymphocytes Absolute: 1.4 10*3/uL (ref 0.7–3.1)
MCHC: 33.9 g/dL (ref 31.5–35.7)
MCV: 93 fL (ref 79–97)
Monocytes Absolute: 0.3 10*3/uL (ref 0.1–0.9)
Neutrophils Absolute: 2.1 10*3/uL (ref 1.4–7.0)
Neutrophils: 53 %
Platelets: 225 10*3/uL (ref 150–450)
WBC: 3.9 10*3/uL (ref 3.4–10.8)

## 2022-06-13 LAB — BASIC METABOLIC PANEL
BUN/Creatinine Ratio: 17 (ref 9–20)
BUN: 18 mg/dL (ref 6–24)
CO2: 23 mmol/L (ref 20–29)
Calcium: 9.3 mg/dL (ref 8.7–10.2)
Chloride: 105 mmol/L (ref 96–106)
Creatinine, Ser: 1.04 mg/dL (ref 0.76–1.27)
Glucose: 99 mg/dL (ref 70–99)
Potassium: 4.7 mmol/L (ref 3.5–5.2)
Sodium: 142 mmol/L (ref 134–144)
eGFR: 83 mL/min/{1.73_m2} (ref >59)

## 2022-06-13 LAB — HEPATIC FUNCTION PANEL
ALT: 18 IU/L (ref 0–44)
AST: 19 IU/L (ref 0–40)
Albumin: 4.5 g/dL (ref 3.8–4.9)
Alkaline Phosphatase: 48 IU/L (ref 44–121)
Bilirubin Total: 0.4 mg/dL (ref 0.0–1.2)
Bilirubin, Direct: 0.11 mg/dL (ref 0.00–0.40)
Total Protein: 6.6 g/dL (ref 6.0–8.5)

## 2022-06-13 LAB — LIPID PANEL WITH LDL/HDL RATIO
Cholesterol, Total: 214 mg/dL — ABNORMAL HIGH (ref 100–199)
HDL: 79 mg/dL (ref >39)
LDL Chol Calc (NIH): 127 mg/dL — ABNORMAL HIGH (ref 0–99)
LDL/HDL Ratio: 1.6 ratio (ref 0.0–3.6)
Triglycerides: 47 mg/dL (ref 0–149)
VLDL Cholesterol Cal: 8 mg/dL (ref 5–40)

## 2022-06-13 LAB — T4, FREE: Free T4: 1.28 ng/dL (ref 0.82–1.77)

## 2022-06-13 LAB — TSH: TSH: 1.94 u[IU]/mL (ref 0.450–4.500)

## 2022-06-13 LAB — VITAMIN D 25 HYDROXY (VIT D DEFICIENCY, FRACTURES): Vit D, 25-Hydroxy: 25.8 ng/mL — ABNORMAL LOW (ref 30.0–100.0)

## 2022-06-13 LAB — PSA: Prostate Specific Ag, Serum: 0.4 ng/mL (ref 0.0–4.0)

## 2022-09-14 ENCOUNTER — Other Ambulatory Visit: Payer: Self-pay

## 2022-09-14 DIAGNOSIS — D709 Neutropenia, unspecified: Secondary | ICD-10-CM

## 2022-09-14 DIAGNOSIS — E039 Hypothyroidism, unspecified: Secondary | ICD-10-CM

## 2022-09-14 DIAGNOSIS — E559 Vitamin D deficiency, unspecified: Secondary | ICD-10-CM

## 2022-09-14 DIAGNOSIS — R609 Edema, unspecified: Secondary | ICD-10-CM

## 2022-09-14 DIAGNOSIS — E785 Hyperlipidemia, unspecified: Secondary | ICD-10-CM

## 2022-09-15 LAB — LIPID PANEL WITH LDL/HDL RATIO
Cholesterol, Total: 186 mg/dL (ref 100–199)
HDL: 73 mg/dL (ref >39)
LDL Chol Calc (NIH): 104 mg/dL — ABNORMAL HIGH (ref 0–99)
LDL/HDL Ratio: 1.4 ratio (ref 0.0–3.6)
Triglycerides: 44 mg/dL (ref 0–149)
VLDL Cholesterol Cal: 9 mg/dL (ref 5–40)

## 2022-09-15 LAB — VITAMIN D 25 HYDROXY (VIT D DEFICIENCY, FRACTURES): Vit D, 25-Hydroxy: 29.4 ng/mL — ABNORMAL LOW (ref 30.0–100.0)

## 2022-09-16 ENCOUNTER — Ambulatory Visit: Payer: BC Managed Care – PPO | Admitting: Dermatology

## 2022-09-16 DIAGNOSIS — L820 Inflamed seborrheic keratosis: Secondary | ICD-10-CM

## 2022-09-16 DIAGNOSIS — L821 Other seborrheic keratosis: Secondary | ICD-10-CM

## 2022-09-16 DIAGNOSIS — D485 Neoplasm of uncertain behavior of skin: Secondary | ICD-10-CM

## 2022-09-16 NOTE — Progress Notes (Signed)
   New Patient Visit   Subjective  Duane Singleton is a 57 y.o. male who presents for the following: The patient has a spot on his right scalp to be evaluated, patient may have concern this could be cancer.  The patient has spots, moles and lesions to be evaluated, some may be new or changing and the patient may have concern these could be cancer.  The following portions of the chart were reviewed this encounter and updated as appropriate: medications, allergies, medical history  Review of Systems:  No other skin or systemic complaints except as noted in HPI or Assessment and Plan.  Objective  Well appearing patient in no apparent distress; mood and affect are within normal limits. A focused examination was performed of the following areas:face,scalp Relevant exam findings are noted in the Assessment and Plan.  right scalp 1.4 cm dark brown waxy papule        Assessment & Plan   SEBORRHEIC KERATOSIS - Stuck-on, waxy, tan-brown papules and/or plaques  - Benign-appearing - Discussed benign etiology and prognosis. - Observe - Call for any changes  Neoplasm of uncertain behavior of skin right scalp  Epidermal / dermal shaving  Lesion diameter (cm):  1.4 Informed consent: discussed and consent obtained   Timeout: patient name, date of birth, surgical site, and procedure verified   Procedure prep:  Patient was prepped and draped in usual sterile fashion Prep type:  Isopropyl alcohol Anesthesia: the lesion was anesthetized in a standard fashion   Anesthetic:  1% lidocaine w/ epinephrine 1-100,000 buffered w/ 8.4% NaHCO3 Instrument used: flexible razor blade   Hemostasis achieved with: pressure, aluminum chloride and electrodesiccation   Outcome: patient tolerated procedure well   Post-procedure details: sterile dressing applied and wound care instructions given   Dressing type: bandage and bacitracin    Specimen 1 - Surgical pathology Differential Diagnosis: D48.5  Nevus vs Dysplastic Nevus r/o Skin Ca  Check Margins: yes 1.4 cm dark brown waxy papule   Return if symptoms worsen or fail to improve.  IAngelique Holm, CMA, am acting as scribe for Armida Sans, MD .   Documentation: I have reviewed the above documentation for accuracy and completeness, and I agree with the above.  Armida Sans, MD

## 2022-09-16 NOTE — Patient Instructions (Addendum)
Wound Care Instructions  Cleanse wound gently with soap and water once a day then pat dry with clean gauze. Apply a thin coat of Petrolatum (petroleum jelly, "Vaseline") over the wound (unless you have an allergy to this). We recommend that you use a new, sterile tube of Vaseline. Do not pick or remove scabs. Do not remove the yellow or white "healing tissue" from the base of the wound.  Cover the wound with fresh, clean, nonstick gauze and secure with paper tape. You may use Band-Aids in place of gauze and tape if the wound is small enough, but would recommend trimming much of the tape off as there is often too much. Sometimes Band-Aids can irritate the skin.  You should call the office for your biopsy report after 1 week if you have not already been contacted.  If you experience any problems, such as abnormal amounts of bleeding, swelling, significant bruising, significant pain, or evidence of infection, please call the office immediately.  FOR ADULT SURGERY PATIENTS: If you need something for pain relief you may take 1 extra strength Tylenol (acetaminophen) AND 2 Ibuprofen (200mg each) together every 4 hours as needed for pain. (do not take these if you are allergic to them or if you have a reason you should not take them.) Typically, you may only need pain medication for 1 to 3 days.     Due to recent changes in healthcare laws, you may see results of your pathology and/or laboratory studies on MyChart before the doctors have had a chance to review them. We understand that in some cases there may be results that are confusing or concerning to you. Please understand that not all results are received at the same time and often the doctors may need to interpret multiple results in order to provide you with the best plan of care or course of treatment. Therefore, we ask that you please give us 2 business days to thoroughly review all your results before contacting the office for clarification. Should  we see a critical lab result, you will be contacted sooner.   If You Need Anything After Your Visit  If you have any questions or concerns for your doctor, please call our main line at [phone] and press option 4 to reach your doctor's medical assistant. If no one answers, please leave a voicemail as directed and we will return your call as soon as possible. Messages left after 4 pm will be answered the following business day.   You may also send us a message via MyChart. We typically respond to MyChart messages within 1-2 business days.  For prescription refills, please ask your pharmacy to contact our office. Our fax number is [phone].  If you have an urgent issue when the clinic is closed that cannot wait until the next business day, you can page your doctor at the number below.    Please note that while we do our best to be available for urgent issues outside of office hours, we are not available 24/7.   If you have an urgent issue and are unable to reach us, you may choose to seek medical care at your doctor's office, retail clinic, urgent care center, or emergency room.  If you have a medical emergency, please immediately call 911 or go to the emergency department.  Pager Numbers  - Dr. Kowalski: [phone]  - Dr. Moye: [phone]  - Dr. Stewart: [phone]  In the event of inclement weather, please call our main line at   [phone] for an update on the status of any delays or closures.  Dermatology Medication Tips: Please keep the boxes that topical medications come in in order to help keep track of the instructions about where and how to use these. Pharmacies typically print the medication instructions only on the boxes and not directly on the medication tubes.   If your medication is too expensive, please contact our office at [phone] option 4 or send us a message through MyChart.   We are unable to tell what your co-pay for medications will be in  advance as this is different depending on your insurance coverage. However, we may be able to find a substitute medication at lower cost or fill out paperwork to get insurance to cover a needed medication.   If a prior authorization is required to get your medication covered by your insurance company, please allow us 1-2 business days to complete this process.  Drug prices often vary depending on where the prescription is filled and some pharmacies may offer cheaper prices.  The website www.goodrx.com contains coupons for medications through different pharmacies. The prices here do not account for what the cost may be with help from insurance (it may be cheaper with your insurance), but the website can give you the price if you did not use any insurance.  - You can print the associated coupon and take it with your prescription to the pharmacy.  - You may also stop by our office during regular business hours and pick up a GoodRx coupon card.  - If you need your prescription sent electronically to a different pharmacy, notify our office through [facility] MyChart or by phone at [phone] option 4.     Si Usted Necesita Algo Despus de Su Visita  Tambin puede enviarnos un mensaje a travs de MyChart. Por lo general respondemos a los mensajes de MyChart en el transcurso de 1 a 2 das hbiles.  Para renovar recetas, por favor pida a su farmacia que se ponga en contacto con nuestra oficina. Nuestro nmero de fax es el [phone].  Si tiene un asunto urgente cuando la clnica est cerrada y que no puede esperar hasta el siguiente da hbil, puede llamar/localizar a su doctor(a) al nmero que aparece a continuacin.   Por favor, tenga en cuenta que aunque hacemos todo lo posible para estar disponibles para asuntos urgentes fuera del horario de oficina, no estamos disponibles las 24 horas del da, los 7 das de la semana.   Si tiene un problema urgente y no puede comunicarse con nosotros, puede  optar por buscar atencin mdica  en el consultorio de su doctor(a), en una clnica privada, en un centro de atencin urgente o en una sala de emergencias.  Si tiene una emergencia mdica, por favor llame inmediatamente al 911 o vaya a la sala de emergencias.  Nmeros de bper  - Dr. Kowalski: [phone]  - Dra. Moye: [phone]  - Dra. Stewart: [phone]  En caso de inclemencias del tiempo, por favor llame a nuestra lnea principal al [phone] para una actualizacin sobre el estado de cualquier retraso o cierre.  Consejos para la medicacin en dermatologa: Por favor, guarde las cajas en las que vienen los medicamentos de uso tpico para ayudarle a seguir las instrucciones sobre dnde y cmo usarlos. Las farmacias generalmente imprimen las instrucciones del medicamento slo en las cajas y no directamente en los tubos del medicamento.   Si su medicamento es muy caro, por favor, pngase en contacto con   nuestra oficina llamando al [phone] y presione la opcin 4 o envenos un mensaje a travs de MyChart.   No podemos decirle cul ser su copago por los medicamentos por adelantado ya que esto es diferente dependiendo de la cobertura de su seguro. Sin embargo, es posible que podamos encontrar un medicamento sustituto a menor costo o llenar un formulario para que el seguro cubra el medicamento que se considera necesario.   Si se requiere una autorizacin previa para que su compaa de seguros cubra su medicamento, por favor permtanos de 1 a 2 das hbiles para completar este proceso.  Los precios de los medicamentos varan con frecuencia dependiendo del lugar de dnde se surte la receta y alguna farmacias pueden ofrecer precios ms baratos.  El sitio web www.goodrx.com tiene cupones para medicamentos de diferentes farmacias. Los precios aqu no tienen en cuenta lo que podra costar con la ayuda del seguro (puede ser ms barato con su seguro), pero el sitio web puede darle el  precio si no utiliz ningn seguro.  - Puede imprimir el cupn correspondiente y llevarlo con su receta a la farmacia.  - Tambin puede pasar por nuestra oficina durante el horario de atencin regular y recoger una tarjeta de cupones de GoodRx.  - Si necesita que su receta se enve electrnicamente a una farmacia diferente, informe a nuestra oficina a travs de MyChart de [place] o por telfono llamando al [phone] y presione la opcin 4.  

## 2022-09-23 ENCOUNTER — Telehealth: Payer: Self-pay

## 2022-09-23 NOTE — Telephone Encounter (Signed)
No answer

## 2022-09-23 NOTE — Telephone Encounter (Signed)
-----   Message from Willeen Niece sent at 09/23/2022 11:25 AM EDT ----- Skin , right scalp SEBORRHEIC KERATOSIS, IRRITATED  Benign irritated age-related growth - please call patient

## 2022-09-25 ENCOUNTER — Encounter: Payer: Self-pay | Admitting: Dermatology

## 2022-09-27 ENCOUNTER — Telehealth: Payer: Self-pay

## 2022-09-27 NOTE — Telephone Encounter (Signed)
-----   Message from Willeen Niece sent at 09/23/2022 11:25 AM EDT ----- Skin , right scalp SEBORRHEIC KERATOSIS, IRRITATED  Benign irritated age-related growth - please call patient

## 2022-09-27 NOTE — Telephone Encounter (Signed)
Left pt msg to call for bx result/sh °

## 2022-09-28 ENCOUNTER — Telehealth: Payer: Self-pay

## 2022-09-28 NOTE — Telephone Encounter (Signed)
-----   Message from Willeen Niece sent at 09/23/2022 11:25 AM EDT ----- Skin , right scalp SEBORRHEIC KERATOSIS, IRRITATED  Benign irritated age-related growth - please call patient

## 2022-09-28 NOTE — Telephone Encounter (Signed)
Patient informed of pathology results 

## 2022-12-10 ENCOUNTER — Other Ambulatory Visit: Payer: Self-pay

## 2022-12-10 DIAGNOSIS — R609 Edema, unspecified: Secondary | ICD-10-CM

## 2022-12-10 DIAGNOSIS — T7840XD Allergy, unspecified, subsequent encounter: Secondary | ICD-10-CM

## 2022-12-10 DIAGNOSIS — R5383 Other fatigue: Secondary | ICD-10-CM

## 2022-12-10 DIAGNOSIS — E559 Vitamin D deficiency, unspecified: Secondary | ICD-10-CM

## 2022-12-10 DIAGNOSIS — E039 Hypothyroidism, unspecified: Secondary | ICD-10-CM

## 2022-12-10 DIAGNOSIS — E785 Hyperlipidemia, unspecified: Secondary | ICD-10-CM

## 2022-12-13 ENCOUNTER — Other Ambulatory Visit: Payer: Self-pay

## 2022-12-13 DIAGNOSIS — R609 Edema, unspecified: Secondary | ICD-10-CM

## 2022-12-13 DIAGNOSIS — E039 Hypothyroidism, unspecified: Secondary | ICD-10-CM

## 2022-12-13 DIAGNOSIS — R5383 Other fatigue: Secondary | ICD-10-CM

## 2022-12-13 DIAGNOSIS — E785 Hyperlipidemia, unspecified: Secondary | ICD-10-CM

## 2022-12-13 DIAGNOSIS — E559 Vitamin D deficiency, unspecified: Secondary | ICD-10-CM

## 2022-12-13 DIAGNOSIS — T7840XD Allergy, unspecified, subsequent encounter: Secondary | ICD-10-CM

## 2022-12-14 LAB — LIPID PANEL WITH LDL/HDL RATIO
Cholesterol, Total: 186 mg/dL (ref 100–199)
HDL: 74 mg/dL (ref >39)
LDL Chol Calc (NIH): 105 mg/dL — ABNORMAL HIGH (ref 0–99)
LDL/HDL Ratio: 1.4 {ratio} (ref 0.0–3.6)
Triglycerides: 35 mg/dL (ref 0–149)
VLDL Cholesterol Cal: 7 mg/dL (ref 5–40)

## 2022-12-14 LAB — CBC WITH DIFFERENTIAL/PLATELET
Basophils Absolute: 0 10*3/uL (ref 0.0–0.2)
Basos: 1 %
EOS (ABSOLUTE): 0.1 10*3/uL (ref 0.0–0.4)
Eos: 2 %
Hematocrit: 42.1 % (ref 37.5–51.0)
Hemoglobin: 13.7 g/dL (ref 13.0–17.7)
Immature Grans (Abs): 0 10*3/uL (ref 0.0–0.1)
Immature Granulocytes: 0 %
Lymphocytes Absolute: 1.3 10*3/uL (ref 0.7–3.1)
Lymphs: 33 %
MCH: 31.4 pg (ref 26.6–33.0)
MCHC: 32.5 g/dL (ref 31.5–35.7)
MCV: 97 fL (ref 79–97)
Monocytes Absolute: 0.4 10*3/uL (ref 0.1–0.9)
Monocytes: 9 %
Neutrophils Absolute: 2.1 10*3/uL (ref 1.4–7.0)
Neutrophils: 55 %
Platelets: 235 10*3/uL (ref 150–450)
RBC: 4.36 x10E6/uL (ref 4.14–5.80)
RDW: 12.7 % (ref 11.6–15.4)
WBC: 3.9 10*3/uL (ref 3.4–10.8)

## 2022-12-14 LAB — BASIC METABOLIC PANEL
BUN/Creatinine Ratio: 21 — ABNORMAL HIGH (ref 9–20)
BUN: 23 mg/dL (ref 6–24)
CO2: 20 mmol/L (ref 20–29)
Calcium: 9.2 mg/dL (ref 8.7–10.2)
Chloride: 105 mmol/L (ref 96–106)
Creatinine, Ser: 1.07 mg/dL (ref 0.76–1.27)
Glucose: 90 mg/dL (ref 70–99)
Potassium: 4.5 mmol/L (ref 3.5–5.2)
Sodium: 141 mmol/L (ref 134–144)
eGFR: 80 mL/min/{1.73_m2} (ref >59)

## 2022-12-14 LAB — HEPATIC FUNCTION PANEL
ALT: 19 [IU]/L (ref 0–44)
AST: 21 [IU]/L (ref 0–40)
Albumin: 4.3 g/dL (ref 3.8–4.9)
Alkaline Phosphatase: 51 [IU]/L (ref 44–121)
Bilirubin Total: 0.3 mg/dL (ref 0.0–1.2)
Bilirubin, Direct: 0.11 mg/dL (ref 0.00–0.40)
Total Protein: 6.5 g/dL (ref 6.0–8.5)

## 2022-12-14 LAB — VITAMIN D 25 HYDROXY (VIT D DEFICIENCY, FRACTURES): Vit D, 25-Hydroxy: 67.3 ng/mL (ref 30.0–100.0)

## 2022-12-14 LAB — T4, FREE: Free T4: 1.36 ng/dL (ref 0.82–1.77)

## 2022-12-14 LAB — TSH: TSH: 4.24 u[IU]/mL (ref 0.450–4.500)

## 2023-02-08 ENCOUNTER — Other Ambulatory Visit: Payer: Self-pay

## 2023-02-08 DIAGNOSIS — E039 Hypothyroidism, unspecified: Secondary | ICD-10-CM

## 2023-02-10 LAB — T4, FREE: Free T4: 1.62 ng/dL (ref 0.82–1.77)

## 2023-02-10 LAB — TSH: TSH: 0.83 u[IU]/mL (ref 0.450–4.500)

## 2023-03-16 ENCOUNTER — Other Ambulatory Visit: Payer: Self-pay

## 2023-03-16 DIAGNOSIS — R609 Edema, unspecified: Secondary | ICD-10-CM

## 2023-03-16 DIAGNOSIS — Z125 Encounter for screening for malignant neoplasm of prostate: Secondary | ICD-10-CM

## 2023-03-16 DIAGNOSIS — E039 Hypothyroidism, unspecified: Secondary | ICD-10-CM

## 2023-03-16 DIAGNOSIS — E785 Hyperlipidemia, unspecified: Secondary | ICD-10-CM

## 2023-03-17 ENCOUNTER — Other Ambulatory Visit: Payer: Self-pay

## 2023-03-17 DIAGNOSIS — R609 Edema, unspecified: Secondary | ICD-10-CM

## 2023-03-17 DIAGNOSIS — E785 Hyperlipidemia, unspecified: Secondary | ICD-10-CM

## 2023-03-17 DIAGNOSIS — E039 Hypothyroidism, unspecified: Secondary | ICD-10-CM

## 2023-03-17 DIAGNOSIS — Z125 Encounter for screening for malignant neoplasm of prostate: Secondary | ICD-10-CM

## 2023-03-18 LAB — TSH: TSH: 2.45 u[IU]/mL (ref 0.450–4.500)

## 2023-03-18 LAB — T4, FREE: Free T4: 1.57 ng/dL (ref 0.82–1.77)

## 2023-03-18 LAB — PSA: Prostate Specific Ag, Serum: 0.3 ng/mL (ref 0.0–4.0)

## 2023-09-12 ENCOUNTER — Other Ambulatory Visit: Payer: Self-pay

## 2023-09-12 DIAGNOSIS — D709 Neutropenia, unspecified: Secondary | ICD-10-CM

## 2023-09-12 DIAGNOSIS — E039 Hypothyroidism, unspecified: Secondary | ICD-10-CM

## 2023-09-12 DIAGNOSIS — M129 Arthropathy, unspecified: Secondary | ICD-10-CM

## 2023-09-12 DIAGNOSIS — R5383 Other fatigue: Secondary | ICD-10-CM

## 2023-09-12 DIAGNOSIS — T7840XD Allergy, unspecified, subsequent encounter: Secondary | ICD-10-CM

## 2023-09-12 DIAGNOSIS — E559 Vitamin D deficiency, unspecified: Secondary | ICD-10-CM

## 2023-09-12 DIAGNOSIS — E785 Hyperlipidemia, unspecified: Secondary | ICD-10-CM

## 2023-09-14 ENCOUNTER — Other Ambulatory Visit: Payer: Self-pay

## 2023-09-14 DIAGNOSIS — D709 Neutropenia, unspecified: Secondary | ICD-10-CM

## 2023-09-14 DIAGNOSIS — E559 Vitamin D deficiency, unspecified: Secondary | ICD-10-CM

## 2023-09-14 DIAGNOSIS — E785 Hyperlipidemia, unspecified: Secondary | ICD-10-CM

## 2023-09-14 DIAGNOSIS — T7840XD Allergy, unspecified, subsequent encounter: Secondary | ICD-10-CM

## 2023-09-14 DIAGNOSIS — E039 Hypothyroidism, unspecified: Secondary | ICD-10-CM

## 2023-09-14 DIAGNOSIS — M129 Arthropathy, unspecified: Secondary | ICD-10-CM

## 2023-09-14 DIAGNOSIS — R5383 Other fatigue: Secondary | ICD-10-CM

## 2023-09-15 LAB — CBC WITH DIFFERENTIAL/PLATELET
Basophils Absolute: 0 x10E3/uL (ref 0.0–0.2)
Basos: 1 %
EOS (ABSOLUTE): 0.1 x10E3/uL (ref 0.0–0.4)
Eos: 2 %
Hematocrit: 45.5 % (ref 37.5–51.0)
Hemoglobin: 14.3 g/dL (ref 13.0–17.7)
Immature Grans (Abs): 0 x10E3/uL (ref 0.0–0.1)
Immature Granulocytes: 0 %
Lymphocytes Absolute: 1.2 x10E3/uL (ref 0.7–3.1)
Lymphs: 35 %
MCH: 30.8 pg (ref 26.6–33.0)
MCHC: 31.4 g/dL — ABNORMAL LOW (ref 31.5–35.7)
MCV: 98 fL — ABNORMAL HIGH (ref 79–97)
Monocytes Absolute: 0.3 x10E3/uL (ref 0.1–0.9)
Monocytes: 9 %
Neutrophils Absolute: 1.8 x10E3/uL (ref 1.4–7.0)
Neutrophils: 53 %
Platelets: 239 x10E3/uL (ref 150–450)
RBC: 4.65 x10E6/uL (ref 4.14–5.80)
RDW: 13.1 % (ref 11.6–15.4)
WBC: 3.3 x10E3/uL — ABNORMAL LOW (ref 3.4–10.8)

## 2023-09-15 LAB — BASIC METABOLIC PANEL WITH GFR
BUN/Creatinine Ratio: 11 (ref 10–24)
BUN: 12 mg/dL (ref 8–27)
CO2: 21 mmol/L (ref 20–29)
Calcium: 9.2 mg/dL (ref 8.6–10.2)
Chloride: 104 mmol/L (ref 96–106)
Creatinine, Ser: 1.09 mg/dL (ref 0.76–1.27)
Glucose: 81 mg/dL (ref 70–99)
Potassium: 4.4 mmol/L (ref 3.5–5.2)
Sodium: 142 mmol/L (ref 134–144)
eGFR: 78 mL/min/1.73 (ref >59)

## 2023-09-15 LAB — HEPATIC FUNCTION PANEL
ALT: 16 IU/L (ref 0–44)
AST: 15 IU/L (ref 0–40)
Albumin: 4.3 g/dL (ref 3.8–4.9)
Alkaline Phosphatase: 51 IU/L (ref 44–121)
Bilirubin Total: 0.4 mg/dL (ref 0.0–1.2)
Bilirubin, Direct: 0.15 mg/dL (ref 0.00–0.40)
Total Protein: 6.5 g/dL (ref 6.0–8.5)

## 2023-09-15 LAB — LIPID PANEL WITH LDL/HDL RATIO
Cholesterol, Total: 193 mg/dL (ref 100–199)
HDL: 74 mg/dL (ref >39)
LDL Chol Calc (NIH): 109 mg/dL — ABNORMAL HIGH (ref 0–99)
LDL/HDL Ratio: 1.5 ratio (ref 0.0–3.6)
Triglycerides: 50 mg/dL (ref 0–149)
VLDL Cholesterol Cal: 10 mg/dL (ref 5–40)

## 2023-09-15 LAB — VITAMIN D 25 HYDROXY (VIT D DEFICIENCY, FRACTURES): Vit D, 25-Hydroxy: 28.2 ng/mL — ABNORMAL LOW (ref 30.0–100.0)

## 2023-09-15 LAB — T3: T3, Total: 105 ng/dL (ref 71–180)

## 2023-09-15 LAB — TSH: TSH: 0.765 u[IU]/mL (ref 0.450–4.500)

## 2023-09-22 LAB — SPECIMEN STATUS REPORT

## 2023-09-22 LAB — T4, FREE: Free T4: 1.57 ng/dL (ref 0.82–1.77)

## 2023-11-17 ENCOUNTER — Ambulatory Visit (INDEPENDENT_AMBULATORY_CARE_PROVIDER_SITE_OTHER): Payer: Self-pay | Admitting: Adult Health

## 2023-11-17 ENCOUNTER — Encounter: Payer: Self-pay | Admitting: Adult Health

## 2023-11-17 VITALS — BP 110/90 | HR 53

## 2023-11-17 DIAGNOSIS — S0121XA Laceration without foreign body of nose, initial encounter: Secondary | ICD-10-CM

## 2023-11-17 NOTE — Progress Notes (Signed)
 [facility] [address] [place], [place] [address]   Office Visit Note  Patient Name: Duane Singleton Date of Birth [id number]  Medical Record number [id number]  Date of Service: 11/17/2023  Chief Complaint  Patient presents with   Acute Visit    Patient knicked the outer edge of his L nostril while shaving. He has been unable to get it to stop bleeding despite holding pressure to it and applying alcohol.     HPI Pt is here for a sick visit. He reports shaving his face while on the phone this morning.  He cut the edge of his nose, and it will not stop bleeding.    Current Medication:  Outpatient Encounter Medications as of 11/17/2023  Medication Sig   levothyroxine (SYNTHROID) 88 MCG tablet Take 88 mcg by mouth daily.   olopatadine  (PATANOL) 0.1 % ophthalmic solution Place 1 drop into the left eye 2 (two) times daily. X 5-7 days (Patient not taking: Reported on 11/17/2023)   [DISCONTINUED] Na Sulfate-K Sulfate-Mg Sulf 17.5-3.13-1.6 GM/177ML SOLN See admin instructions. (Patient not taking: Reported on 01/22/2022)   No facility-administered encounter medications on file as of 11/17/2023.      Medical History: Past Medical History:  Diagnosis Date   Hypothyroidism      Vital Signs: BP (!) 110/90   Pulse (!) 53   SpO2 98%    Review of Systems  Skin:  Positive for wound.    Physical Exam Vitals reviewed.  Constitutional:      Appearance: Normal appearance.  HENT:     Nose:      Comments: Circular area of skin removed from edge of left nostril.  Mild bleeding noted.  Neurological:     Mental Status: He is alert.     Assessment/Plan: 1. Laceration of nose, initial encounter (Primary) Hold pressure, and place a small amount of bacitracin on wound. Bleeding controlled in office.  Given supplies to do again, if bleeding starts back.  Discussed silver nitrate, pt will return if bleeding continues, and is uncontrollable.      General Counseling:  Aamari verbalizes understanding of the findings of todays visit and agrees with plan of treatment. I have discussed any further diagnostic evaluation that may be needed or ordered today. We also reviewed his medications today. he has been encouraged to call the office with any questions or concerns that should arise related to todays visit.   No orders of the defined types were placed in this encounter.   No orders of the defined types were placed in this encounter.   Time spent:15 Minutes    Juliene DOROTHA Howells AGNP-C Nurse Practitioner

## 2023-12-08 ENCOUNTER — Encounter: Payer: Self-pay | Admitting: Internal Medicine

## 2023-12-08 ENCOUNTER — Ambulatory Visit: Admitting: Internal Medicine

## 2023-12-08 VITALS — BP 106/68 | HR 60 | Ht 65.0 in | Wt 184.6 lb

## 2023-12-08 DIAGNOSIS — D708 Other neutropenia: Secondary | ICD-10-CM | POA: Insufficient documentation

## 2023-12-08 DIAGNOSIS — E782 Mixed hyperlipidemia: Secondary | ICD-10-CM | POA: Insufficient documentation

## 2023-12-08 DIAGNOSIS — R5383 Other fatigue: Secondary | ICD-10-CM | POA: Diagnosis not present

## 2023-12-08 DIAGNOSIS — E890 Postprocedural hypothyroidism: Secondary | ICD-10-CM | POA: Diagnosis not present

## 2023-12-08 DIAGNOSIS — Z125 Encounter for screening for malignant neoplasm of prostate: Secondary | ICD-10-CM | POA: Insufficient documentation

## 2023-12-08 DIAGNOSIS — E559 Vitamin D deficiency, unspecified: Secondary | ICD-10-CM | POA: Insufficient documentation

## 2023-12-08 DIAGNOSIS — Z0130 Encounter for examination of blood pressure without abnormal findings: Secondary | ICD-10-CM

## 2023-12-08 NOTE — Progress Notes (Signed)
 New Patient Office Visit  Subjective   Patient ID: Duane Singleton, male    DOB: [birth year]  Age: 58 y.o. MRN: [id number]  CC:  Chief Complaint  Patient presents with   Establish Care    NPE    HPI Duane Singleton presents to establish care Previous Primary Care provider/office:   he does have additional concerns to discuss today.   Patient comes in to establish PMD. His only medication is Levoxyl started after postablative treatment for Grave's disease. He has a family history of Thyroid  disorder. He is generally feeling well but suspects low energy due to low Testosterone.He has discussed Androgel in the past , but his levels never checked. Will check levels. He exercises regularly, and has lost weight, which has helped reducing joint pains. Had a negative stress test few years ago. Last Colon 2023-Negative.    Outpatient Encounter Medications as of 12/08/2023  Medication Sig   levothyroxine (SYNTHROID) 88 MCG tablet Take 88 mcg by mouth daily.   olopatadine  (PATANOL) 0.1 % ophthalmic solution Place 1 drop into the left eye 2 (two) times daily. X 5-7 days (Patient not taking: Reported on 12/08/2023)   No facility-administered encounter medications on file as of 12/08/2023.    Past Medical History:  Diagnosis Date   Hypothyroidism     Past Surgical History:  Procedure Laterality Date   COLONOSCOPY WITH PROPOFOL  N/A 06/25/2021   Procedure: COLONOSCOPY WITH PROPOFOL ;  Surgeon: Therisa Bi, MD;  [facility] ENDOSCOPY;  Service: Gastroenterology;  Laterality: N/A;    History reviewed. No pertinent family history.  Social History   Socioeconomic History   Marital status: Married    Spouse name: Not on file   Number of children: Not on file   Years of education: Not on file   Highest education level: Not on file  Occupational History   Not on file  Tobacco Use   Smoking status: Never   Smokeless tobacco: Never  Vaping Use   Vaping status: Never Used   Substance and Sexual Activity   Alcohol use: Never   Drug use: Never   Sexual activity: Yes  Other Topics Concern   Not on file  Social History Narrative   Not on file   Social Drivers of Health   Financial Resource Strain: Not on file  Food Insecurity: Not on file  Transportation Needs: Not on file  Physical Activity: Not on file  Stress: Not on file  Social Connections: Not on file  Intimate Partner Violence: Not on file    Review of Systems  Constitutional: Negative.  Negative for chills, fever and malaise/fatigue.  HENT: Negative.  Negative for congestion and sore throat.   Eyes: Negative.  Negative for blurred vision and pain.  Respiratory: Negative.  Negative for cough and shortness of breath.   Cardiovascular: Negative.  Negative for chest pain, palpitations and leg swelling.  Gastrointestinal: Negative.  Negative for abdominal pain, blood in stool, constipation, diarrhea, heartburn, melena, nausea and vomiting.  Genitourinary: Negative.  Negative for dysuria, flank pain, frequency and urgency.  Musculoskeletal: Negative.  Negative for joint pain and myalgias.  Skin: Negative.   Neurological: Negative.  Negative for dizziness, tingling, sensory change, weakness and headaches.  Endo/Heme/Allergies: Negative.   Psychiatric/Behavioral: Negative.  Negative for depression and suicidal ideas. The patient is not nervous/anxious.         Objective   BP 106/68   Pulse 60   Ht 5' 5 (1.651 m)   Wt 184  lb 9.6 oz (83.7 kg)   SpO2 98%   BMI 30.72 kg/m   Physical Exam Vitals and nursing note reviewed.  Constitutional:      General: He is not in acute distress.    Appearance: Normal appearance. He is not ill-appearing.  HENT:     Head: Normocephalic and atraumatic.     Nose: Nose normal.     Mouth/Throat:     Mouth: Mucous membranes are moist.     Pharynx: Oropharynx is clear.  Eyes:     Conjunctiva/sclera: Conjunctivae normal.     Pupils: Pupils are equal,  round, and reactive to light.  Cardiovascular:     Rate and Rhythm: Normal rate and regular rhythm.     Pulses: Normal pulses.     Heart sounds: Normal heart sounds.  Pulmonary:     Effort: Pulmonary effort is normal.     Breath sounds: Normal breath sounds. No wheezing or rhonchi.  Abdominal:     General: Bowel sounds are normal. There is no distension.     Palpations: Abdomen is soft.     Tenderness: There is no abdominal tenderness.  Musculoskeletal:        General: Normal range of motion.     Cervical back: Normal range of motion and neck supple.     Right lower leg: No edema.     Left lower leg: No edema.  Skin:    General: Skin is warm and dry.     Capillary Refill: Capillary refill takes less than 2 seconds.  Neurological:     General: No focal deficit present.     Mental Status: He is alert and oriented to person, place, and time.     Sensory: No sensory deficit.     Motor: No weakness.  Psychiatric:        Mood and Affect: Mood normal.        Behavior: Behavior normal.        Judgment: Judgment normal.        Assessment & Plan:  Check labs.Will consider Androgel if needed. Continue meds. Problem List Items Addressed This Visit     Postablative hypothyroidism - Primary   Other neutropenia   Mixed hyperlipidemia   Screening for prostate cancer   Other fatigue   Vitamin D  deficiency    Return in about 3 months (around 03/09/2024).   Total time spent: 30 minutes  Duane FREDY RAMAN, MD  12/08/2023   This document may have been prepared by [facility] Voice Recognition software and as such may include unintentional dictation errors.

## 2023-12-09 ENCOUNTER — Other Ambulatory Visit

## 2023-12-11 LAB — CMP14+EGFR
ALT: 19 IU/L (ref 0–44)
AST: 18 IU/L (ref 0–40)
Albumin: 4.6 g/dL (ref 3.8–4.9)
Alkaline Phosphatase: 48 IU/L (ref 47–123)
BUN/Creatinine Ratio: 14 (ref 10–24)
BUN: 15 mg/dL (ref 8–27)
Bilirubin Total: 0.4 mg/dL (ref 0.0–1.2)
CO2: 25 mmol/L (ref 20–29)
Calcium: 9.7 mg/dL (ref 8.6–10.2)
Chloride: 105 mmol/L (ref 96–106)
Creatinine, Ser: 1.06 mg/dL (ref 0.76–1.27)
Globulin, Total: 2 g/dL (ref 1.5–4.5)
Glucose: 94 mg/dL (ref 70–99)
Potassium: 5.1 mmol/L (ref 3.5–5.2)
Sodium: 141 mmol/L (ref 134–144)
Total Protein: 6.6 g/dL (ref 6.0–8.5)
eGFR: 80 mL/min/1.73 (ref >59)

## 2023-12-11 LAB — CBC WITH DIFFERENTIAL/PLATELET
Basophils Absolute: 0 x10E3/uL (ref 0.0–0.2)
Basos: 1 %
EOS (ABSOLUTE): 0.1 x10E3/uL (ref 0.0–0.4)
Eos: 2 %
Hematocrit: 44 % (ref 37.5–51.0)
Hemoglobin: 14.6 g/dL (ref 13.0–17.7)
Immature Grans (Abs): 0 x10E3/uL (ref 0.0–0.1)
Immature Granulocytes: 0 %
Lymphocytes Absolute: 1.6 x10E3/uL (ref 0.7–3.1)
Lymphs: 43 %
MCH: 31.7 pg (ref 26.6–33.0)
MCHC: 33.2 g/dL (ref 31.5–35.7)
MCV: 95 fL (ref 79–97)
Monocytes Absolute: 0.3 x10E3/uL (ref 0.1–0.9)
Monocytes: 8 %
Neutrophils Absolute: 1.6 x10E3/uL (ref 1.4–7.0)
Neutrophils: 46 %
Platelets: 251 x10E3/uL (ref 150–450)
RBC: 4.61 x10E6/uL (ref 4.14–5.80)
RDW: 12.6 % (ref 11.6–15.4)
WBC: 3.6 x10E3/uL (ref 3.4–10.8)

## 2023-12-11 LAB — TSH+T4F+T3FREE
Free T4: 1.47 ng/dL (ref 0.82–1.77)
T3, Free: 3 pg/mL (ref 2.0–4.4)
TSH: 1.34 u[IU]/mL (ref 0.450–4.500)

## 2023-12-11 LAB — LIPID PANEL W/O CHOL/HDL RATIO
Cholesterol, Total: 206 mg/dL — ABNORMAL HIGH (ref 100–199)
HDL: 82 mg/dL (ref >39)
LDL Chol Calc (NIH): 115 mg/dL — ABNORMAL HIGH (ref 0–99)
Triglycerides: 49 mg/dL (ref 0–149)
VLDL Cholesterol Cal: 9 mg/dL (ref 5–40)

## 2023-12-11 LAB — PSA: Prostate Specific Ag, Serum: 0.4 ng/mL (ref 0.0–4.0)

## 2023-12-11 LAB — VITAMIN D 25 HYDROXY (VIT D DEFICIENCY, FRACTURES): Vit D, 25-Hydroxy: 49.7 ng/mL (ref 30.0–100.0)

## 2023-12-11 LAB — TESTOSTERONE,FREE AND TOTAL
Testosterone, Free: 10.3 pg/mL (ref 6.6–18.1)
Testosterone: 473 ng/dL (ref 264–916)

## 2023-12-12 ENCOUNTER — Ambulatory Visit: Payer: Self-pay | Admitting: Internal Medicine

## 2023-12-15 NOTE — Progress Notes (Signed)
 Patient notified

## 2023-12-22 ENCOUNTER — Other Ambulatory Visit: Payer: Self-pay

## 2023-12-22 MED ORDER — LEVOTHYROXINE SODIUM 88 MCG PO TABS: 88.0000 ug | ORAL_TABLET | Freq: Every day | ORAL | 3 refills | Status: AC

## 2024-02-09 ENCOUNTER — Encounter: Payer: Self-pay | Admitting: Adult Health

## 2024-02-09 ENCOUNTER — Other Ambulatory Visit: Payer: Self-pay

## 2024-02-09 ENCOUNTER — Ambulatory Visit: Payer: Self-pay | Admitting: Adult Health

## 2024-02-09 VITALS — BP 118/82 | HR 60 | Temp 97.5°F | Ht 65.0 in | Wt 179.0 lb

## 2024-02-09 DIAGNOSIS — N529 Male erectile dysfunction, unspecified: Secondary | ICD-10-CM

## 2024-02-09 MED ORDER — TADALAFIL 5 MG PO TABS: 5.0000 mg | ORAL_TABLET | Freq: Every day | ORAL | 1 refills | Status: DC

## 2024-02-09 NOTE — Progress Notes (Signed)
 Therapist, [facility] [address] [place], [place] [address]   Office Visit Note  Patient Name: Duane Singleton Date of Birth [id number]  Medical Record number [id number]  Date of Service: 02/09/2024  Chief Complaint  Patient presents with   Acute Visit    Patient requesting a prescription for Cialis. He tried someone else's in the past and felt it was helpful.     HPI Pt is here requesting Cialis.  He has tried some from a friend and found that he had great results with no side effects.  He reports some difficulty getting and maintaining erection in the past, but nothing serious.  He has found that he has good improvement in erection quality and would like to continue to use this.    Current Medication:  Outpatient Encounter Medications as of 02/09/2024  Medication Sig   levothyroxine  (SYNTHROID ) 88 MCG tablet Take 1 tablet (88 mcg total) by mouth daily.   olopatadine  (PATANOL) 0.1 % ophthalmic solution Place 1 drop into the left eye 2 (two) times daily. X 5-7 days (Patient taking differently: Place 1 drop into the left eye 2 (two) times daily as needed. X 5-7 days)   tadalafil (CIALIS) 5 MG tablet Take 1 tablet (5 mg total) by mouth daily.   No facility-administered encounter medications on file as of 02/09/2024.      Medical History: Past Medical History:  Diagnosis Date   Hypothyroidism      Vital Signs: BP 118/82   Pulse 60   Temp (!) 97.5 F (36.4 C)   Ht 5' 5 (1.651 m)   Wt 179 lb (81.2 kg)   SpO2 99%   BMI 29.79 kg/m    Review of Systems  Constitutional:  Negative for chills, fatigue and fever.    Physical Exam Vitals reviewed.  Constitutional:      Appearance: Normal appearance.  Neurological:     Mental Status: He is alert.     Assessment/Plan: 1. Erectile dysfunction, unspecified erectile dysfunction type (Primary) Take Cialis as discussed. Follow up as needed.  - tadalafil (CIALIS) 5 MG tablet; Take 1 tablet (5 mg total) by mouth  daily.  Dispense: 30 tablet; Refill: 1     General Counseling: Duane Singleton verbalizes understanding of the findings of todays visit and agrees with plan of treatment. I have discussed any further diagnostic evaluation that may be needed or ordered today. We also reviewed his medications today. he has been encouraged to call the office with any questions or concerns that should arise related to todays visit.   No orders of the defined types were placed in this encounter.   Meds ordered this encounter  Medications   tadalafil (CIALIS) 5 MG tablet    Sig: Take 1 tablet (5 mg total) by mouth daily.    Dispense:  30 tablet    Refill:  1    Time spent:15 Minutes    Juliene DOROTHA Howells AGNP-C Nurse Practitioner

## 2024-03-12 ENCOUNTER — Encounter: Payer: Self-pay | Admitting: Internal Medicine

## 2024-03-12 ENCOUNTER — Ambulatory Visit: Admitting: Internal Medicine

## 2024-03-12 VITALS — BP 112/76 | HR 61 | Ht 65.0 in | Wt 188.6 lb

## 2024-03-12 DIAGNOSIS — E782 Mixed hyperlipidemia: Secondary | ICD-10-CM | POA: Diagnosis not present

## 2024-03-12 DIAGNOSIS — Z0130 Encounter for examination of blood pressure without abnormal findings: Secondary | ICD-10-CM

## 2024-03-12 DIAGNOSIS — E890 Postprocedural hypothyroidism: Secondary | ICD-10-CM | POA: Diagnosis not present

## 2024-03-12 NOTE — Progress Notes (Signed)
 "  Established Patient Office Visit  Subjective:  Patient ID: Duane Singleton, male    DOB: 21-Aug-1965  Age: 59 y.o. MRN: [id number]  Chief Complaint  Patient presents with   Follow-up    3 month follow up    Patient comes in for his follow-up visit today.  He is generally feeling well and has no new complaints.  However he has gained some weight.  He agrees to start a strict diet control and exercise program. Patient will return fasting for blood work.    No other concerns at this time.   Past Medical History:  Diagnosis Date   Hypothyroidism     Past Surgical History:  Procedure Laterality Date   COLONOSCOPY WITH PROPOFOL  N/A 06/25/2021   Procedure: COLONOSCOPY WITH PROPOFOL ;  Surgeon: Therisa Bi, MD;  Location: [facility] ENDOSCOPY;  Service: Gastroenterology;  Laterality: N/A;    Social History   Socioeconomic History   Marital status: Married    Spouse name: Not on file   Number of children: Not on file   Years of education: Not on file   Highest education level: Not on file  Occupational History   Not on file  Tobacco Use   Smoking status: Never   Smokeless tobacco: Never  Vaping Use   Vaping status: Never Used  Substance and Sexual Activity   Alcohol use: Never   Drug use: Never   Sexual activity: Yes  Other Topics Concern   Not on file  Social History Narrative   Not on file   Social Drivers of Health   Tobacco Use: Low Risk (03/12/2024)   Patient History    Smoking Tobacco Use: Never    Smokeless Tobacco Use: Never    Passive Exposure: Not on file  Financial Resource Strain: Not on file  Food Insecurity: Not on file  Transportation Needs: Not on file  Physical Activity: Not on file  Stress: Not on file  Social Connections: Not on file  Intimate Partner Violence: Not on file  Depression (PHQ2-9): Low Risk (12/08/2023)   Depression (PHQ2-9)    PHQ-2 Score: 0  Alcohol Screen: Not on file  Housing: Not on file  Utilities: Not on file  Health  Literacy: Not on file    Family History  Problem Relation Age of Onset   Hypertension Mother    Hypertension Father     Allergies[1]  Show/hide medication list[2]  Review of Systems  Constitutional: Negative.  Negative for chills, fever and malaise/fatigue.  HENT: Negative.  Negative for congestion and sore throat.   Eyes: Negative.  Negative for blurred vision and pain.  Respiratory: Negative.  Negative for cough and shortness of breath.   Cardiovascular: Negative.  Negative for chest pain, palpitations and leg swelling.  Gastrointestinal: Negative.  Negative for abdominal pain, blood in stool, constipation, diarrhea, heartburn, melena, nausea and vomiting.  Genitourinary: Negative.  Negative for dysuria, flank pain, frequency and urgency.  Musculoskeletal: Negative.  Negative for joint pain and myalgias.  Skin: Negative.   Neurological: Negative.  Negative for dizziness, tingling, sensory change, weakness and headaches.  Endo/Heme/Allergies: Negative.   Psychiatric/Behavioral: Negative.  Negative for depression and suicidal ideas. The patient is not nervous/anxious.        Objective:   BP 112/76   Pulse 61   Ht 5' 5 (1.651 m)   Wt 188 lb 9.6 oz (85.5 kg)   SpO2 95%   BMI 31.38 kg/m   Vitals:   03/12/24 1329  BP: 112/76  Pulse: 61  Height: 5' 5 (1.651 m)  Weight: 188 lb 9.6 oz (85.5 kg)  SpO2: 95%  BMI (Calculated): 31.38    Physical Exam Vitals and nursing note reviewed.  Constitutional:      General: He is not in acute distress.    Appearance: Normal appearance. He is not ill-appearing.  HENT:     Head: Normocephalic and atraumatic.     Nose: Nose normal.     Mouth/Throat:     Mouth: Mucous membranes are moist.     Pharynx: Oropharynx is clear.  Eyes:     Conjunctiva/sclera: Conjunctivae normal.     Pupils: Pupils are equal, round, and reactive to light.  Cardiovascular:     Rate and Rhythm: Normal rate and regular rhythm.     Pulses: Normal  pulses.     Heart sounds: Normal heart sounds.  Pulmonary:     Effort: Pulmonary effort is normal.     Breath sounds: Normal breath sounds. No wheezing or rhonchi.  Abdominal:     General: Bowel sounds are normal. There is no distension.     Palpations: Abdomen is soft.     Tenderness: There is no abdominal tenderness.  Musculoskeletal:        General: Normal range of motion.     Cervical back: Normal range of motion and neck supple.     Right lower leg: No edema.     Left lower leg: No edema.  Skin:    General: Skin is warm and dry.     Capillary Refill: Capillary refill takes less than 2 seconds.  Neurological:     General: No focal deficit present.     Mental Status: He is alert and oriented to person, place, and time.     Sensory: No sensory deficit.     Motor: No weakness.  Psychiatric:        Mood and Affect: Mood normal.        Behavior: Behavior normal.        Judgment: Judgment normal.      No results found for any visits on 03/12/24.  No results found for this or any previous visit (from the past 2160 hours).    Assessment & Plan:  Continue current medications.  Strict diet control, exercise, weight reduction emphasized.  Patient will return for fasting labs. Problem List Items Addressed This Visit       Endocrine   Postablative hypothyroidism - Primary   Relevant Orders   TSH     Other   Mixed hyperlipidemia   Relevant Orders   Lipid Panel w/o Chol/HDL Ratio    Return in about 4 months (around 07/10/2024).   Total time spent: 25 minutes. This time includes review of previous notes and results and patient face to face interaction during today's visit.    [name], MD  03/12/2024   This document may have been prepared by [facility] Voice Recognition software and as such may include unintentional dictation errors.     [1] No Known Allergies [2]  Outpatient Medications Prior to Visit  Medication Sig   levothyroxine  (SYNTHROID ) 88 MCG tablet  Take 1 tablet (88 mcg total) by mouth daily.   olopatadine  (PATANOL) 0.1 % ophthalmic solution Place 1 drop into the left eye 2 (two) times daily. X 5-7 days (Patient taking differently: Place 1 drop into the left eye 2 (two) times daily as needed. X 5-7 days)   tadalafil  (CIALIS ) 5 MG tablet Take 1 tablet (  5 mg total) by mouth daily. (Patient taking differently: Take 5 mg by mouth daily as needed.)   No facility-administered medications prior to visit.   "

## 2024-03-12 NOTE — Progress Notes (Deleted)
 "  New Patient Office Visit  Subjective   Patient ID: Duane Singleton, male    DOB: 07-05-1965  Age: 59 y.o. MRN: [id number]  CC:  Chief Complaint  Patient presents with   Follow-up    3 month follow up    HPI Duane Singleton presents to establish care Previous Primary Care provider/office:   he {DOES NOT does:27190::does not} have additional concerns to discuss today.   Patient comes in for his follow-up today.  He is generally feeling well and has no new complaints.  He takes his medications regularly.  He has gained some weight and will start a strict diet control and exercise regimen.  He will return fasting for his lipid panel. Denies chest pain, no palpitations, no shortness of breath.    Outpatient Encounter Medications as of 03/12/2024  Medication Sig   levothyroxine  (SYNTHROID ) 88 MCG tablet Take 1 tablet (88 mcg total) by mouth daily.   olopatadine  (PATANOL) 0.1 % ophthalmic solution Place 1 drop into the left eye 2 (two) times daily. X 5-7 days (Patient taking differently: Place 1 drop into the left eye 2 (two) times daily as needed. X 5-7 days)   tadalafil  (CIALIS ) 5 MG tablet Take 1 tablet (5 mg total) by mouth daily. (Patient taking differently: Take 5 mg by mouth daily as needed.)   No facility-administered encounter medications on file as of 03/12/2024.    Past Medical History:  Diagnosis Date   Hypothyroidism     Past Surgical History:  Procedure Laterality Date   COLONOSCOPY WITH PROPOFOL  N/A 06/25/2021   Procedure: COLONOSCOPY WITH PROPOFOL ;  Surgeon: Therisa Bi, MD;  Location: [facility] ENDOSCOPY;  Service: Gastroenterology;  Laterality: N/A;    Family History  Problem Relation Age of Onset   Hypertension Mother    Hypertension Father     Social History   Socioeconomic History   Marital status: Married    Spouse name: Not on file   Number of children: Not on file   Years of education: Not on file   Highest education level: Not on file   Occupational History   Not on file  Tobacco Use   Smoking status: Never   Smokeless tobacco: Never  Vaping Use   Vaping status: Never Used  Substance and Sexual Activity   Alcohol use: Never   Drug use: Never   Sexual activity: Yes  Other Topics Concern   Not on file  Social History Narrative   Not on file   Social Drivers of Health   Tobacco Use: Low Risk (03/12/2024)   Patient History    Smoking Tobacco Use: Never    Smokeless Tobacco Use: Never    Passive Exposure: Not on file  Financial Resource Strain: Not on file  Food Insecurity: Not on file  Transportation Needs: Not on file  Physical Activity: Not on file  Stress: Not on file  Social Connections: Not on file  Intimate Partner Violence: Not on file  Depression (EYV7-0): Low Risk (12/08/2023)   Depression (PHQ2-9)    PHQ-2 Score: 0  Alcohol Screen: Not on file  Housing: Not on file  Utilities: Not on file  Health Literacy: Not on file    Review of Systems  Constitutional: Negative.  Negative for chills, fever and malaise/fatigue.  HENT: Negative.  Negative for congestion and sore throat.   Eyes: Negative.  Negative for blurred vision and pain.  Respiratory: Negative.  Negative for cough and shortness of breath.   Cardiovascular: Negative.  Negative for chest pain, palpitations and leg swelling.  Gastrointestinal: Negative.  Negative for abdominal pain, blood in stool, constipation, diarrhea, heartburn, melena, nausea and vomiting.  Genitourinary: Negative.  Negative for dysuria, flank pain, frequency and urgency.  Musculoskeletal: Negative.  Negative for joint pain and myalgias.  Skin: Negative.   Neurological: Negative.  Negative for dizziness, tingling, sensory change, weakness and headaches.  Endo/Heme/Allergies: Negative.   Psychiatric/Behavioral: Negative.  Negative for depression and suicidal ideas. The patient is not nervous/anxious.         Objective   BP 112/76   Pulse 61   Ht 5' 5 (1.651  m)   Wt 188 lb 9.6 oz (85.5 kg)   SpO2 95%   BMI 31.38 kg/m   Physical Exam Vitals and nursing note reviewed.  Constitutional:      General: He is not in acute distress.    Appearance: Normal appearance. He is not ill-appearing.  HENT:     Head: Normocephalic and atraumatic.     Nose: Nose normal.     Mouth/Throat:     Mouth: Mucous membranes are moist.     Pharynx: Oropharynx is clear.  Eyes:     Conjunctiva/sclera: Conjunctivae normal.     Pupils: Pupils are equal, round, and reactive to light.  Cardiovascular:     Rate and Rhythm: Normal rate and regular rhythm.     Pulses: Normal pulses.     Heart sounds: Normal heart sounds.  Pulmonary:     Effort: Pulmonary effort is normal.     Breath sounds: Normal breath sounds. No wheezing or rhonchi.  Abdominal:     General: Bowel sounds are normal. There is no distension.     Palpations: Abdomen is soft.     Tenderness: There is no abdominal tenderness.  Musculoskeletal:        General: Normal range of motion.     Cervical back: Normal range of motion and neck supple.     Right lower leg: No edema.     Left lower leg: No edema.  Skin:    General: Skin is warm and dry.     Capillary Refill: Capillary refill takes less than 2 seconds.  Neurological:     General: No focal deficit present.     Mental Status: He is alert and oriented to person, place, and time.     Sensory: No sensory deficit.     Motor: No weakness.  Psychiatric:        Mood and Affect: Mood normal.        Behavior: Behavior normal.        Judgment: Judgment normal.        Assessment & Plan:  Patient to continue his medications.  Strict diet control, exercise, weight reduction emphasized. Patient will return for fasting labs. Problem List Items Addressed This Visit       Endocrine   Postablative hypothyroidism - Primary   Relevant Orders   TSH     Other   Mixed hyperlipidemia   Relevant Orders   Lipid Panel w/o Chol/HDL Ratio    Return in  about 4 months (around 07/10/2024).   Total time spent: 25 minutes. This time includes review of previous notes and results and patient face to face interaction during today's visit.    Duane FREDY RAMAN, MD  03/12/2024   This document may have been prepared by [facility] Voice Recognition software and as such may include unintentional dictation errors.  "

## 2024-03-13 ENCOUNTER — Other Ambulatory Visit

## 2024-03-14 LAB — LIPID PANEL W/O CHOL/HDL RATIO
Cholesterol, Total: 214 mg/dL — ABNORMAL HIGH (ref 100–199)
HDL: 76 mg/dL
LDL Chol Calc (NIH): 131 mg/dL — ABNORMAL HIGH (ref 0–99)
Triglycerides: 40 mg/dL (ref 0–149)
VLDL Cholesterol Cal: 7 mg/dL (ref 5–40)

## 2024-03-14 LAB — TSH: TSH: 1.81 u[IU]/mL (ref 0.450–4.500)

## 2024-03-15 ENCOUNTER — Ambulatory Visit: Payer: Self-pay | Admitting: Internal Medicine

## 2024-03-15 NOTE — Progress Notes (Signed)
 Pt informed

## 2024-04-06 ENCOUNTER — Other Ambulatory Visit: Payer: Self-pay | Admitting: Adult Health

## 2024-04-06 DIAGNOSIS — N529 Male erectile dysfunction, unspecified: Secondary | ICD-10-CM

## 2024-04-06 MED ORDER — TADALAFIL 5 MG PO TABS: 5.0000 mg | ORAL_TABLET | Freq: Every day | ORAL | 0 refills | Status: AC | PRN

## 2024-07-10 ENCOUNTER — Ambulatory Visit: Admitting: Internal Medicine
